# Patient Record
Sex: Male | Born: 1950 | Race: White | Hispanic: No | Marital: Married | Smoking: Former smoker
Health system: Southern US, Community
[De-identification: ages and names within clinical notes are randomized; demographics above are authoritative.]

## PROBLEM LIST (undated history)

## (undated) DIAGNOSIS — M1990 Unspecified osteoarthritis, unspecified site: Secondary | ICD-10-CM

## (undated) DIAGNOSIS — M51369 Other intervertebral disc degeneration, lumbar region without mention of lumbar back pain or lower extremity pain: Secondary | ICD-10-CM

## (undated) DIAGNOSIS — I10 Essential (primary) hypertension: Secondary | ICD-10-CM

## (undated) DIAGNOSIS — M5030 Other cervical disc degeneration, unspecified cervical region: Secondary | ICD-10-CM

## (undated) DIAGNOSIS — M5136 Other intervertebral disc degeneration, lumbar region: Secondary | ICD-10-CM

## (undated) HISTORY — DX: Other cervical disc degeneration, unspecified cervical region: M50.30

## (undated) HISTORY — PX: HEMORRHOID SURGERY: SHX153

## (undated) HISTORY — DX: Other intervertebral disc degeneration, lumbar region: M51.36

## (undated) HISTORY — DX: Essential (primary) hypertension: I10

## (undated) HISTORY — DX: Other intervertebral disc degeneration, lumbar region without mention of lumbar back pain or lower extremity pain: M51.369

## (undated) HISTORY — PX: KNEE SURGERY: SHX244

## (undated) HISTORY — DX: Unspecified osteoarthritis, unspecified site: M19.90

---

## 2002-02-23 ENCOUNTER — Emergency Department (HOSPITAL_COMMUNITY): Admission: EM | Admit: 2002-02-23 | Discharge: 2002-02-23 | Payer: Self-pay | Admitting: *Deleted

## 2005-07-14 ENCOUNTER — Ambulatory Visit: Payer: Self-pay | Admitting: Family Medicine

## 2005-07-19 ENCOUNTER — Ambulatory Visit: Payer: Self-pay | Admitting: Internal Medicine

## 2005-07-23 ENCOUNTER — Ambulatory Visit (HOSPITAL_BASED_OUTPATIENT_CLINIC_OR_DEPARTMENT_OTHER): Admission: RE | Admit: 2005-07-23 | Discharge: 2005-07-23 | Payer: Self-pay | Admitting: General Surgery

## 2005-07-23 ENCOUNTER — Encounter (INDEPENDENT_AMBULATORY_CARE_PROVIDER_SITE_OTHER): Payer: Self-pay | Admitting: *Deleted

## 2005-09-29 ENCOUNTER — Encounter: Admission: RE | Admit: 2005-09-29 | Discharge: 2005-09-29 | Payer: Self-pay | Admitting: Family Medicine

## 2005-09-29 ENCOUNTER — Ambulatory Visit: Payer: Self-pay | Admitting: Family Medicine

## 2005-10-06 ENCOUNTER — Ambulatory Visit: Payer: Self-pay | Admitting: Family Medicine

## 2006-11-01 ENCOUNTER — Ambulatory Visit: Payer: Self-pay | Admitting: Family Medicine

## 2006-11-10 ENCOUNTER — Ambulatory Visit: Payer: Self-pay | Admitting: Family Medicine

## 2006-11-10 LAB — CONVERTED CEMR LAB
ALT: 34 units/L (ref 0–40)
AST: 31 units/L (ref 0–37)
Albumin: 4.4 g/dL (ref 3.5–5.2)
Alkaline Phosphatase: 48 units/L (ref 39–117)
BUN: 16 mg/dL (ref 6–23)
CO2: 30 meq/L (ref 19–32)
Calcium: 10 mg/dL (ref 8.4–10.5)
Chloride: 98 meq/L (ref 96–112)
Chol/HDL Ratio, serum: 4.3
Cholesterol: 236 mg/dL (ref 0–200)
Creatinine, Ser: 1.2 mg/dL (ref 0.4–1.5)
GFR calc non Af Amer: 67 mL/min
Glomerular Filtration Rate, Af Am: 81 mL/min/{1.73_m2}
Glucose, Bld: 103 mg/dL — ABNORMAL HIGH (ref 70–99)
HCT: 42.9 % (ref 39.0–52.0)
HDL: 54.4 mg/dL (ref >39.0)
Hemoglobin: 14.5 g/dL (ref 13.0–17.0)
LDL DIRECT: 165.5 mg/dL
MCHC: 33.8 g/dL (ref 30.0–36.0)
MCV: 94.4 fL (ref 78.0–100.0)
PSA: 0.71 ng/mL (ref 0.10–4.00)
Platelets: 228 10*3/uL (ref 150–400)
Potassium: 5.2 meq/L — ABNORMAL HIGH (ref 3.5–5.1)
RBC: 4.54 M/uL (ref 4.22–5.81)
RDW: 12.2 % (ref 11.5–14.6)
Sodium: 136 meq/L (ref 135–145)
TSH: 1.94 microintl units/mL (ref 0.35–5.50)
Total Bilirubin: 0.9 mg/dL (ref 0.3–1.2)
Total Protein: 7.4 g/dL (ref 6.0–8.3)
Triglyceride fasting, serum: 114 mg/dL (ref 0–149)
VLDL: 23 mg/dL (ref 0–40)
WBC: 5.9 10*3/uL (ref 4.5–10.5)

## 2006-11-23 ENCOUNTER — Encounter: Admission: RE | Admit: 2006-11-23 | Discharge: 2006-11-23 | Payer: Self-pay | Admitting: Family Medicine

## 2006-11-28 ENCOUNTER — Ambulatory Visit: Payer: Self-pay | Admitting: Internal Medicine

## 2006-12-26 ENCOUNTER — Ambulatory Visit: Payer: Self-pay | Admitting: Internal Medicine

## 2006-12-26 ENCOUNTER — Encounter (INDEPENDENT_AMBULATORY_CARE_PROVIDER_SITE_OTHER): Payer: Self-pay | Admitting: Specialist

## 2007-09-25 ENCOUNTER — Telehealth: Payer: Self-pay | Admitting: Family Medicine

## 2007-10-05 ENCOUNTER — Telehealth: Payer: Self-pay | Admitting: Family Medicine

## 2007-10-12 ENCOUNTER — Ambulatory Visit (HOSPITAL_COMMUNITY): Admission: RE | Admit: 2007-10-12 | Discharge: 2007-10-12 | Payer: Self-pay | Admitting: Family Medicine

## 2007-12-01 ENCOUNTER — Encounter: Payer: Self-pay | Admitting: Family Medicine

## 2011-04-09 NOTE — Op Note (Signed)
[name] [name], [name] [name]                 ACCOUNT NO.:  [id number]   MEDICAL RECORD NO.:  [id number]          PATIENT TYPE:  AMB   LOCATION:  DSC                          FACILITY:  [facility]   PHYSICIAN:  Gabrielle Dare. [name], M.D.DATE OF BIRTH:  September 27, 1951   DATE OF PROCEDURE:  07/23/2005  DATE OF DISCHARGE:                                 OPERATIVE REPORT   PREOPERATIVE DIAGNOSIS:  Anal skin tags x 2.   POSTOPERATIVE DIAGNOSIS:  Anal skin tags x 2.   PROCEDURE:  Excision of anal skin tags x 2.   SURGEON:  Gabrielle Dare. [name], M.D.   ANESTHESIA:  General.   HISTORY OF PRESENT ILLNESS:  The patient is a 60 year old male who I  evaluated in the office for some anal discomfort including itching and  trouble with hygiene.  He is noted to have a large left posterolateral and a  moderate right posterolateral external anal skin tags.  He presents today  for elective excision.   PROCEDURE IN DETAIL:  Informed consent was obtained.  The patient received  intravenous antibiotics.  He was identified and brought to the operating  room.  General anesthesia with LMA was administered.  He was placed in the  lithotomy position.  His perianal region was prepped and draped in a sterile  fashion.  0.25% Marcaine with epinephrine was injected for local anesthetic.  Digital rectal exam was accomplished but did not show any abnormalities  internally.  Attention was directed first to the right sided skin tag which  was about 1 cm in size.  This was sharply incised.  Bovie cautery was used  to get good hemostasis and the tag was sent to pathology.  Next, the large  left anal skin tag was bilobed and this was excised sharply.  It was about 2  cm in size.  Bovie cautery was used to get good hemostasis.  Subsequently,  an additional small piece from the right side was sent, as well.  The wound  was irrigated, hemostasis was insured.  Subsequently, the left wound was  closed in a simple fashion with running 3-0 chromic  and the wound from the  excision of the tag on the right side was also closed similarly in a running  fashion with 3-0 chromic suture.  Some pressure was held and hemostasis was  insured.  A sterile dressing was applied.  Sponge, needle, and instrument  counts were correct.  The patient tolerated the procedure well and was taken  to the recovery room in stable condition.  There were no apparent  complications.      Gabrielle Dare [name], M.D.  Electronically Signed     BET/MEDQ  D:  07/23/2005  T:  07/23/2005  Job:  [id number]

## 2011-04-09 NOTE — Assessment & Plan Note (Signed)
[facility]                                 ON-CALL NOTE   NAME:[name],[name], [name]                      MRN:          [id number]  DATE:09/22/2007                            DOB:          [birth year]    TIME OF CALL:  5:48pm   TELEPHONE NUMBER:  [phone]   SUBJECTIVE:  He has had high blood pressure the last couple of days. It  felt like it was high yesterday. He finally took his blood pressure and  it was 140/89 with a headache this morning and he again had a headache.  His blood pressure was 149/89, which went down to 137/90, which he took  late this afternoon. He took it when he got home and it was 173/93. I  was not sure that it was right, so repeated it was 176/96. He now  complains of his left arm and hand being tingly. He takes generic  Lopressor 50 mg in the morning and 25 mg at night. He has not had any  new medications or lifestyle changes. He has been a little agitated and  irritable the last few days.   OBJECTIVE:  Elevated blood pressure without other obvious changes.   PLAN:  I would suggest that he start taking 50 mg twice daily, which  would mean he takes a whole tablet now instead of his usual half tablet.  Do that through the weekend and call Monday for an appointment to be  seen by Dr. Tawanna Cooler and see if the readjustment of medicine was enough.   PRIMARY CARE PHYSICIAN:  Dr. Tawanna Cooler.   HOME OFFICE:  [facility].     Arta Silence, MD  Electronically Signed    RNS/MedQ  DD: 09/22/2007  DT: 09/24/2007  Job #: [id number]

## 2012-01-18 ENCOUNTER — Encounter: Payer: Self-pay | Admitting: Internal Medicine

## 2012-09-07 ENCOUNTER — Encounter: Payer: Self-pay | Admitting: Internal Medicine

## 2013-06-05 ENCOUNTER — Encounter: Payer: Self-pay | Admitting: Internal Medicine

## 2017-01-19 ENCOUNTER — Encounter (INDEPENDENT_AMBULATORY_CARE_PROVIDER_SITE_OTHER): Payer: Self-pay

## 2017-01-19 ENCOUNTER — Ambulatory Visit (INDEPENDENT_AMBULATORY_CARE_PROVIDER_SITE_OTHER): Payer: 59 | Admitting: Orthopedic Surgery

## 2017-01-19 ENCOUNTER — Ambulatory Visit (INDEPENDENT_AMBULATORY_CARE_PROVIDER_SITE_OTHER): Payer: 59

## 2017-01-19 ENCOUNTER — Encounter (INDEPENDENT_AMBULATORY_CARE_PROVIDER_SITE_OTHER): Payer: Self-pay | Admitting: Orthopedic Surgery

## 2017-01-19 VITALS — BP 115/73 | HR 51 | Resp 12 | Ht 66.0 in | Wt 152.0 lb

## 2017-01-19 DIAGNOSIS — M19042 Primary osteoarthritis, left hand: Secondary | ICD-10-CM | POA: Diagnosis not present

## 2017-01-19 DIAGNOSIS — M19041 Primary osteoarthritis, right hand: Secondary | ICD-10-CM

## 2017-01-19 DIAGNOSIS — M1811 Unilateral primary osteoarthritis of first carpometacarpal joint, right hand: Secondary | ICD-10-CM

## 2017-01-19 DIAGNOSIS — M79641 Pain in right hand: Secondary | ICD-10-CM | POA: Diagnosis not present

## 2017-01-19 DIAGNOSIS — M79642 Pain in left hand: Secondary | ICD-10-CM

## 2017-01-19 DIAGNOSIS — M180 Bilateral primary osteoarthritis of first carpometacarpal joints: Secondary | ICD-10-CM | POA: Diagnosis not present

## 2017-01-19 DIAGNOSIS — M1812 Unilateral primary osteoarthritis of first carpometacarpal joint, left hand: Secondary | ICD-10-CM | POA: Diagnosis not present

## 2017-01-19 DIAGNOSIS — M5136 Other intervertebral disc degeneration, lumbar region: Secondary | ICD-10-CM | POA: Insufficient documentation

## 2017-01-19 LAB — CBC WITH DIFFERENTIAL/PLATELET
Basophils Absolute: 59 cells/uL (ref 0–200)
Basophils Relative: 1 %
Eosinophils Absolute: 177 cells/uL (ref 15–500)
Eosinophils Relative: 3 %
HCT: 41.1 % (ref 38.5–50.0)
Hemoglobin: 13.6 g/dL (ref 13.2–17.1)
Lymphocytes Relative: 28 %
Lymphs Abs: 1652 cells/uL (ref 850–3900)
MCH: 30.8 pg (ref 27.0–33.0)
MCHC: 33.1 g/dL (ref 32.0–36.0)
MCV: 93 fL (ref 80.0–100.0)
MPV: 10.3 fL (ref 7.5–12.5)
Monocytes Absolute: 649 cells/uL (ref 200–950)
Monocytes Relative: 11 %
NEUTROS ABS: 3363 {cells}/uL (ref 1500–7800)
Neutrophils Relative %: 57 %
Platelets: 256 10*3/uL (ref 140–400)
RBC: 4.42 MIL/uL (ref 4.20–5.80)
RDW: 13 % (ref 11.0–15.0)
WBC: 5.9 10*3/uL (ref 3.8–10.8)

## 2017-01-19 MED ORDER — LIDOCAINE HCL 1 % IJ SOLN
1.0000 mL | INTRAMUSCULAR | Status: AC | PRN
  Administered 2017-01-19: 1 mL

## 2017-01-19 MED ORDER — BUPIVACAINE HCL 0.5 % IJ SOLN
1.0000 mL | INTRAMUSCULAR | Status: AC | PRN
  Administered 2017-01-19: 1 mL via INTRA_ARTICULAR

## 2017-01-19 MED ORDER — METHYLPREDNISOLONE ACETATE 40 MG/ML IJ SUSP
40.0000 mg | INTRAMUSCULAR | Status: AC | PRN
  Administered 2017-01-19: 40 mg via INTRA_ARTICULAR

## 2017-01-19 NOTE — Progress Notes (Signed)
Office Visit Note   Patient: [name] [name]           Date of Birth: 09/14/1951           MRN: [id number] Visit Date: 01/19/2017              Requested by: No referring provider defined for this encounter. PCP: Marda Stalker, PA-C   Assessment & Plan: Visit Diagnoses:  1. 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 Pain in right hand   3. Pain in left hand   4. Osteoarthritis of left hand, unspecified osteoarthritis type   5. Primary osteoarthritis, right hand     Plan:  #1: Injection of both CMC joints of the hand #2: Obtain rheumatologic screening blood work  Follow-Up Instructions: Return if symptoms worsen or fail to improve.   Orders:  Orders Placed This Encounter  Procedures  . XR Hand Complete Right  . XR Hand Complete Left  . CK (Creatine Kinase)  . C-reactive protein  . CBC with Differential  . COMPLETE METABOLIC PANEL WITH GFR  . Cyclic citrul peptide antibody, IgG  . Rheumatoid Factor  . Antinuclear Antib (ANA)  . Uric acid   No orders of the defined types were placed in this encounter.     Procedures: Medium Joint Inj Date/Time: 01/19/2017 3:33 PM Performed by: Biagio Borg D Authorized by: Biagio Borg D   Consent Given by:  Patient Site marked: the procedure site was marked   Timeout: prior to procedure the correct patient, procedure, and site was verified   Indications:  Pain Location:  Wrist Site:  R intercarpal Prep: patient was prepped and draped in usual sterile fashion   Needle Size:  25 G Needle Length:  1.5 inches Approach:  Anteromedial Ultrasound Guided: No   Fluoroscopic Guidance: No   Medications:  1 mL lidocaine 1 %; 1 mL bupivacaine 0.5 %; 40 mg methylPREDNISolone acetate 40 MG/ML Aspiration Attempted: No   Medium Joint Inj Date/Time: 01/19/2017 3:34 PM Performed by: Biagio Borg D Authorized by: Biagio Borg D   Consent Given by:  Patient Site marked: the procedure site was marked     Timeout: prior to procedure the correct patient, procedure, and site was verified   Indications:  Pain Location:  Wrist Site:  L intercarpal Prep: patient was prepped and draped in usual sterile fashion   Needle Size:  25 G Needle Length:  1.5 inches Approach:  Anteromedial Ultrasound Guided: No   Fluoroscopic Guidance: No   Medications:  1 mL lidocaine 1 %; 1 mL bupivacaine 0.5 %; 40 mg methylPREDNISolone acetate 40 MG/ML Aspiration Attempted: No       Clinical Data: No additional findings.   Subjective: Chief Complaint  Patient presents with  . Left Thumb - Pain  . Right Thumb - Pain    Bil thumb joint  pain x 10 years, makes eye glasses, worse x 2 1/2 years, swelling, severe pain, Diclofenac po and diclofenac gel helps however less effective - using more daily, not diabetic, no previous thumb surgery, hand exercises.    Review of Systems  Constitutional: Negative.   HENT: Negative.   Respiratory: Negative.   Cardiovascular: Negative.   Gastrointestinal: Negative.   Genitourinary: Negative.   Skin: Negative.   Neurological: Negative.   Hematological: Negative.   Psychiatric/Behavioral: Negative.      Objective: Vital Signs: BP 115/73 (BP Location: Right Arm, Patient Position: Sitting, Cuff Size: Normal)   Pulse Marland Kitchen)  51   Resp 12   Ht 5\' 6"  (1.676 m)   Wt 152 lb (68.9 kg)   BMI 24.53 kg/m   Physical Exam  Constitutional: He is oriented to person, place, and time. He appears well-developed and well-nourished.  HENT:  Head: Normocephalic and atraumatic.  Eyes: EOM are normal. Pupils are equal, round, and reactive to light.  Pulmonary/Chest: Effort normal.  Neurological: He is alert and oriented to person, place, and time.  Skin: Skin is warm and dry.  Psychiatric: He has a normal mood and affect. His behavior is normal. Judgment and thought content normal.    Ortho Exam  Bilateral first CMC pain to palpation. He does have some swelling diffusely of  the MCP joints of the index and long finger bilaterally. Do not feel any warmth  Specialty Comments:  No specialty comments available.  Imaging: Xr Hand Complete Left  Result Date: 01/19/2017 Three-view x-rays left hand reveals joint space narrowing second and third MCP with cystic changes in the metacarpal heads. Does have some subluxation of the first [facility] joint.  Xr Hand Complete Right  Result Date: 01/19/2017 3 views right hand reveals first second third distal metacarpal changes. Joint space narrowing especially second and third CP joints. Does have a large cyst proximal first metacarpal generative changes in the first [facility] joint with subluxation of the metacarpal.    PMFS History: Patient Active Problem List   Diagnosis Date Noted  . DDD (degenerative disc disease), lumbar    Past Medical History:  Diagnosis Date  . DDD (degenerative disc disease), cervical   . DDD (degenerative disc disease), lumbar   . DDD (degenerative disc disease), lumbar   . Hypertension   . Osteoarthritis     History reviewed. No pertinent family history.  Past Surgical History:  Procedure Laterality Date  . HEMORRHOID SURGERY    . KNEE SURGERY     Social History   Occupational History  . Not on file.   Social History Main Topics  . Smoking status: Former Smoker    Packs/day: 0.25    Years: 30.00    Types: Cigarettes    Quit date: 2000  . Smokeless tobacco: Never Used  . Alcohol use 8.4 oz/week    14 Cans of beer per week  . Drug use: No  . Sexual activity: Not on file

## 2017-01-20 LAB — COMPLETE METABOLIC PANEL WITH GFR
ALBUMIN: 4.6 g/dL (ref 3.6–5.1)
ALT: 24 U/L (ref 9–46)
AST: 26 U/L (ref 10–35)
Alkaline Phosphatase: 57 U/L (ref 40–115)
BUN: 22 mg/dL (ref 7–25)
CO2: 20 mmol/L (ref 20–31)
CREATININE: 1.12 mg/dL (ref 0.70–1.25)
Calcium: 9.7 mg/dL (ref 8.6–10.3)
Chloride: 97 mmol/L — ABNORMAL LOW (ref 98–110)
GFR, Est African American: 80 mL/min (ref >=60)
GFR, Est Non African American: 70 mL/min (ref >=60)
GLUCOSE: 71 mg/dL (ref 65–99)
Potassium: 5.2 mmol/L (ref 3.5–5.3)
Sodium: 133 mmol/L — ABNORMAL LOW (ref 135–146)
Total Bilirubin: 0.4 mg/dL (ref 0.2–1.2)
Total Protein: 6.9 g/dL (ref 6.1–8.1)

## 2017-01-20 LAB — RHEUMATOID FACTOR: Rheumatoid fact SerPl-aCnc: <14 IU/mL (ref <14)

## 2017-01-20 LAB — ANA: Anti Nuclear Antibody(ANA): NEGATIVE

## 2017-01-20 LAB — CYCLIC CITRUL PEPTIDE ANTIBODY, IGG: Cyclic Citrullin Peptide Ab: <16 Units

## 2017-01-20 LAB — C-REACTIVE PROTEIN: CRP: 0.8 mg/L (ref <8.0)

## 2017-01-20 LAB — CK: CK TOTAL: 164 U/L (ref 7–232)

## 2017-01-20 LAB — URIC ACID: Uric Acid, Serum: 5.2 mg/dL (ref 4.0–8.0)

## 2017-01-26 ENCOUNTER — Telehealth (INDEPENDENT_AMBULATORY_CARE_PROVIDER_SITE_OTHER): Payer: Self-pay | Admitting: Orthopaedic Surgery

## 2017-01-26 NOTE — Telephone Encounter (Signed)
Patient left a message for Aaron Edelman to call him back to discuss the lab work that was done at his last visit.  [phone].  Thank you

## 2017-01-27 NOTE — Telephone Encounter (Signed)
See message.

## 2017-02-01 ENCOUNTER — Telehealth (INDEPENDENT_AMBULATORY_CARE_PROVIDER_SITE_OTHER): Payer: Self-pay | Admitting: Orthopaedic Surgery

## 2017-02-01 NOTE — Telephone Encounter (Signed)
Please call.

## 2017-02-01 NOTE — Telephone Encounter (Signed)
Please call results

## 2017-02-01 NOTE — Telephone Encounter (Signed)
Patient left a message this morning wanting to get his lab results.  [phone].  Thank you.

## 2017-02-03 NOTE — Telephone Encounter (Signed)
clled reults yesterday

## 2017-02-03 NOTE — Telephone Encounter (Signed)
thanks

## 2017-05-11 ENCOUNTER — Other Ambulatory Visit: Payer: Self-pay | Admitting: Orthopaedic Surgery

## 2017-05-11 ENCOUNTER — Ambulatory Visit (INDEPENDENT_AMBULATORY_CARE_PROVIDER_SITE_OTHER): Payer: 59 | Admitting: Orthopedic Surgery

## 2017-05-11 ENCOUNTER — Ambulatory Visit (INDEPENDENT_AMBULATORY_CARE_PROVIDER_SITE_OTHER): Payer: 59

## 2017-05-11 ENCOUNTER — Encounter (INDEPENDENT_AMBULATORY_CARE_PROVIDER_SITE_OTHER): Payer: Self-pay | Admitting: Orthopedic Surgery

## 2017-05-11 ENCOUNTER — Ambulatory Visit
Admission: RE | Admit: 2017-05-11 | Discharge: 2017-05-11 | Disposition: A | Discharge status: Home / Self Care | Payer: 59 | Source: Ambulatory Visit | Attending: Orthopaedic Surgery | Admitting: Orthopaedic Surgery

## 2017-05-11 VITALS — BP 112/65 | HR 60 | Resp 15 | Ht 66.0 in | Wt 152.0 lb

## 2017-05-11 DIAGNOSIS — M25562 Pain in left knee: Secondary | ICD-10-CM | POA: Diagnosis not present

## 2017-05-11 DIAGNOSIS — G8929 Other chronic pain: Secondary | ICD-10-CM

## 2017-05-11 DIAGNOSIS — M5416 Radiculopathy, lumbar region: Secondary | ICD-10-CM

## 2017-05-11 DIAGNOSIS — M1712 Unilateral primary osteoarthritis, left knee: Secondary | ICD-10-CM

## 2017-05-11 MED ORDER — BUPIVACAINE HCL 0.5 % IJ SOLN
3.0000 mL | INTRAMUSCULAR | Status: AC | PRN
  Administered 2017-05-11: 3 mL via INTRA_ARTICULAR

## 2017-05-11 MED ORDER — LIDOCAINE HCL 1 % IJ SOLN
5.0000 mL | INTRAMUSCULAR | Status: AC | PRN
  Administered 2017-05-11: 5 mL

## 2017-05-11 MED ORDER — METHYLPREDNISOLONE ACETATE 40 MG/ML IJ SUSP
80.0000 mg | INTRAMUSCULAR | Status: AC | PRN
Start: 2017-05-11 — End: 2017-05-11
  Administered 2017-05-11: 80 mg

## 2017-05-11 NOTE — Progress Notes (Signed)
Office Visit Note   Patient: [name] [name]           Date of Birth: 08/25/1951           MRN: [id number] Visit Date: 05/11/2017              Requested by: [name] Stalker, PA-C [name] [name], [place] [id number] PCP: [name] Stalker, PA-C   Assessment & Plan: Visit Diagnoses:  1. Unilateral primary osteoarthritis, left knee   2. Chronic pain of left knee     Plan:  #1: Corticosteroid injection was given today atraumatically. He did have marked relief in his knee pain but as he started to walk he started getting his spinal stenotic pain. This was more in the lateral aspect of his hip #2: Since his Visco supplementation was beneficial previously were going to pre-certify for another round. #3: Follow back up once approved.  Follow-Up Instructions: No Follow-up on file.   Orders:  Orders Placed This Encounter  Procedures  . XR Knee Complete 4 Views Left   No orders of the defined types were placed in this encounter.     Procedures: Large Joint Inj Date/Time: 05/11/2017 10:23 AM Performed by: [name] [name] Authorized by: [name] [name]   Consent Given by:  Patient Timeout: prior to procedure the correct patient, procedure, and site was verified   Indications:  Pain and joint swelling Location:  Knee Site:  L knee Prep: patient was prepped and draped in usual sterile fashion   Needle Size:  25 G Needle Length:  1.5 inches Approach:  Anteromedial Ultrasound Guidance: No   Fluoroscopic Guidance: No   Arthrogram: No   Medications:  5 mL lidocaine 1 %; 80 mg methylPREDNISolone acetate 40 MG/ML; 3 mL bupivacaine 0.5 % Aspiration Attempted: No   Patient tolerance:  Patient tolerated the procedure well with no immediate complications     Clinical Data: No additional findings.   Subjective: Chief Complaint  Patient presents with  . Left Knee - Pain  . Knee Pain    Knee pain worsening, twisted knee x 1 1/2, difficulty walking, wants inj., low  back pain, gait change, using cane, out of work since 05/05/17, no swellling, popping, clicking, grinding noise, difficulty sleeping at night, not diabetic, arthroscopic surgery MW 2004, Voltaren and tylenol - cannot tell if it's helping,    [name] [name] is a very pleasant 66 year old white male who is seen today for evaluation of his left knee. He states that he has been continuing to have pain in the left knee and has had previous x-rays revealing bone-on-bone medial compartment OA. He is also having spinal stenosis of the lumbar spine which is giving him pain and discomfort into his left leg more in the lateral hip and anterior and some in the groin. He is being followed by neurosurgery and is having an MRI hopefully schedule this once he gets approval for it for the lumbar spine. He states though that he has to use a cane now because of his gait change and the pain in his left knee. He has been taken out of work now on 05/05/2017. He does not have difficulty at night with sleeping. Previous arthroscopic surgery in 2004. He has tried Voltaren and Tylenol which has not been much in the way of any benefit. Seen today for evaluation.        Review of Systems  Constitutional: Negative.   HENT: Negative.   Respiratory: Negative.   Cardiovascular: Negative.  Gastrointestinal: Negative.   Genitourinary: Negative.   Skin: Negative.   Neurological: Negative.   Hematological: Negative.   Psychiatric/Behavioral: Negative.      Objective: Vital Signs: BP 112/65 (BP Location: Right Arm, Patient Position: Sitting, Cuff Size: Normal)   Pulse 60   Resp 15   Ht 5\' 6"  (1.676 m)   Wt 152 lb (68.9 kg)   BMI 24.53 kg/m   Physical Exam  Constitutional: He is oriented to person, place, and time. He appears well-developed and well-nourished.  HENT:  Head: Normocephalic and atraumatic.  Eyes: EOM are normal. Pupils are equal, round, and reactive to light.  Pulmonary/Chest: Effort normal.  Neurological: He  is alert and oriented to person, place, and time.  Skin: Skin is warm and dry.  Psychiatric: He has a normal mood and affect. His behavior is normal. Judgment and thought content normal.    Ortho Exam  Range of motion reveals near full extension to about 105. Tender at the medial joint line to palpation. He has varus and does look about 13-14 clinically. I can correct him to almost neutral. He does not have much pain with range of motion of the hip.  Specialty Comments:  No specialty comments available.  Imaging: No results found.   PMFS History: Patient Active Problem List   Diagnosis Date Noted  . DDD (degenerative disc disease), lumbar    Past Medical History:  Diagnosis Date  . DDD (degenerative disc disease), cervical   . DDD (degenerative disc disease), lumbar   . DDD (degenerative disc disease), lumbar   . Hypertension   . Osteoarthritis     History reviewed. No pertinent family history.  Past Surgical History:  Procedure Laterality Date  . HEMORRHOID SURGERY    . KNEE SURGERY     Social History   Occupational History  . Not on file.   Social History Main Topics  . Smoking status: Former Smoker    Packs/day: 0.25    Years: 30.00    Types: Cigarettes    Quit date: 2000  . Smokeless tobacco: Never Used  . Alcohol use 8.4 oz/week    14 Cans of beer per week  . Drug use: No  . Sexual activity: Not on file

## 2017-05-20 ENCOUNTER — Telehealth (INDEPENDENT_AMBULATORY_CARE_PROVIDER_SITE_OTHER): Payer: Self-pay | Admitting: Orthopaedic Surgery

## 2017-05-20 NOTE — Telephone Encounter (Signed)
Larene Beach from [facility] called to advise that the medical director has denied Euflexxa for this patient.  We can do a peer to peer, but it must be done within 21 days of today's date.  The peer to peer telephone # is [phone] option 3.  REF# [id number].  Thank you.

## 2017-05-23 NOTE — Telephone Encounter (Signed)
Please advise 

## 2017-05-24 ENCOUNTER — Telehealth (INDEPENDENT_AMBULATORY_CARE_PROVIDER_SITE_OTHER): Payer: Self-pay | Admitting: Rheumatology

## 2017-05-24 NOTE — Telephone Encounter (Signed)
Please advise 

## 2017-05-24 NOTE — Telephone Encounter (Signed)
Ok to do

## 2017-05-24 NOTE — Telephone Encounter (Signed)
Patient called and said he received his denial from [facility] for [name] [name].  I told him there was a note in chart for Dr Durward Fortes to call for peer to peer.  Please call him and let him know once you have called, thanks.

## 2017-05-24 NOTE — Telephone Encounter (Signed)
(   Dr. Durward Fortes patient) Patient would like to know about getting an rx for an Unloader Brace for his knee. Patient would like to discuss this with someone. Patient has several questions regarding brace, Rx for brace, and facility where he would get the brace. Please call patient to discuss.

## 2017-05-26 ENCOUNTER — Telehealth (INDEPENDENT_AMBULATORY_CARE_PROVIDER_SITE_OTHER): Payer: Self-pay

## 2017-05-26 NOTE — Telephone Encounter (Signed)
Called pt and wrote rx for [facility]. Left a fd

## 2017-06-01 ENCOUNTER — Telehealth (INDEPENDENT_AMBULATORY_CARE_PROVIDER_SITE_OTHER): Payer: Self-pay | Admitting: Orthopaedic Surgery

## 2017-06-01 NOTE — Telephone Encounter (Signed)
05/11/2017 ov note faxed to [facility]

## 2017-06-02 ENCOUNTER — Telehealth (INDEPENDENT_AMBULATORY_CARE_PROVIDER_SITE_OTHER): Payer: Self-pay | Admitting: Orthopaedic Surgery

## 2017-06-02 NOTE — Telephone Encounter (Signed)
Patient called in regards to his Hyalgan injections.  Wants to know the status of this and the peer to peer.

## 2017-06-02 NOTE — Telephone Encounter (Signed)
Please advise 

## 2017-06-10 ENCOUNTER — Telehealth: Payer: Self-pay

## 2017-06-10 NOTE — Telephone Encounter (Signed)
I have spoken with patient multiple times today and he is very upset that he has called multiple times to ask about his injection approval per insurance and no one has called him back.  Looking at the chart notes it appears that peer to peer was not done.  Patient has been seeing Aaron Edelman and has been going to PT for left knee pain, he has been out of work all this time as well.  He has many concerns that we did not do what we were supposed to do with this.  I have advised him we would look further into what happened so as to avoid this in the future.  He is now scheduled for injections to start next week with Aaron Edelman, and per Dr Durward Fortes, ok to supply meds for patient and do no charge visits.

## 2017-06-10 NOTE — Telephone Encounter (Signed)
Spoke with [facility] rep and he wanted me to send office notes to show this pt has OA of left knee. Faxed info today.

## 2017-06-14 ENCOUNTER — Ambulatory Visit (INDEPENDENT_AMBULATORY_CARE_PROVIDER_SITE_OTHER): Payer: 59 | Admitting: Orthopedic Surgery

## 2017-06-14 ENCOUNTER — Encounter (INDEPENDENT_AMBULATORY_CARE_PROVIDER_SITE_OTHER): Payer: Self-pay | Admitting: Orthopedic Surgery

## 2017-06-14 DIAGNOSIS — M1712 Unilateral primary osteoarthritis, left knee: Secondary | ICD-10-CM

## 2017-06-14 MED ORDER — SODIUM HYALURONATE (VISCOSUP) 20 MG/2ML IX SOSY
20.0000 mg | PREFILLED_SYRINGE | INTRA_ARTICULAR | Status: AC | PRN
  Administered 2017-06-14: 20 mg via INTRA_ARTICULAR

## 2017-06-14 MED ORDER — LIDOCAINE HCL 2 % IJ SOLN
2.0000 mL | INTRAMUSCULAR | Status: AC | PRN
  Administered 2017-06-14: 2 mL

## 2017-06-14 NOTE — Progress Notes (Signed)
Office Visit Note   Patient: [name] [name]           Date of Birth: Dec 04, 1950           MRN: [id number] Visit Date: 06/14/2017              Requested by: Marda Stalker, PA-C [place], [place] [id number] PCP: Marda Stalker, PA-C   Assessment & Plan: Visit Diagnoses:  1. Unilateral primary osteoarthritis, left knee     Plan: #1: First Euflex injection was given without difficulty to the left knee #2: Follow back up in 1 week for the second injection.  Follow-Up Instructions: Return in about 1 week (around 06/21/2017).   Orders:  No orders of the defined types were placed in this encounter.  No orders of the defined types were placed in this encounter.     Procedures: Large Joint Inj Date/Time: 06/14/2017 4:49 PM Performed by: Biagio Borg D Authorized by: Biagio Borg D   Consent Given by:  Patient Timeout: prior to procedure the correct patient, procedure, and site was verified   Indications:  Pain and joint swelling Location:  Knee Site:  L knee Prep: patient was prepped and draped in usual sterile fashion   Needle Size:  25 G Needle Length:  1.5 inches Approach:  Anteromedial Ultrasound Guidance: No   Fluoroscopic Guidance: No   Arthrogram: No   Medications:  20 mg Sodium Hyaluronate 20 MG/2ML; 2 mL lidocaine 2 % Aspiration Attempted: No   Patient tolerance:  Patient tolerated the procedure well with no immediate complications     Clinical Data: No additional findings.   Subjective: No chief complaint on file.   [name] [name] is a very pleasant 66 year old white male who is seen today for evaluation of his left knee. He states that he has been continuing to have pain in the left knee and has had previous x-rays revealing bone-on-bone medial compartment OA. He is also having spinal stenosis of the lumbar spine which is giving him pain and discomfort into his left leg more in the lateral hip and anterior and some in the groin. He is  being followed by neurosurgery and has had an MRI. Apparently they've reviewed this with the neurosurgeon who feels that he has is a candidate for essentially an L1-L5 arthrodesis. However he did not feel confident that this would saw his problems. He states though that he has to use a cane now because of his gait change and the pain in his left knee. He has been taken out of work now on 05/05/2017. He does not have difficulty at night with sleeping. Previous arthroscopic surgery in 2004. He has tried Voltaren and Tylenol which has not been much in the way of any benefit. Recently I instilled a corticosteroid injection to his left knee. He did have some benefit. He would like to have a Visco supplementation injection which gave him much relief previously.  He is also had a brace from the brace shop which is basically an unloading brace. He feels it is made a difference. Also his neurosurgeon has started physical therapy and he states that he is actually had improvement in his pain in his back from that.    Review of Systems  Constitutional: Negative.   HENT: Negative.   Respiratory: Negative.   Cardiovascular: Negative.   Gastrointestinal: Negative.   Genitourinary: Negative.   Skin: Negative.   Neurological: Negative.   Hematological: Negative.   Psychiatric/Behavioral: Negative.  Objective: Vital Signs: There were no vitals taken for this visit.  Physical Exam  Constitutional: He is oriented to person, place, and time. He appears well-developed and well-nourished.  HENT:  Head: Normocephalic and atraumatic.  Eyes: Pupils are equal, round, and reactive to light. EOM are normal.  Pulmonary/Chest: Effort normal.  Neurological: He is alert and oriented to person, place, and time.  Skin: Skin is warm and dry.  Psychiatric: He has a normal mood and affect. His behavior is normal. Judgment and thought content normal.    Ortho Exam  Range of motion reveals near full extension to  about 105. Tender at the medial joint line to palpation. He has varus and does look about 13-14 clinically. I can correct him to almost neutral.   Specialty Comments:  No specialty comments available.  Imaging: No results found.   PMFS History: Current Outpatient Prescriptions  Medication Sig Dispense Refill  . ALPRAZolam (XANAX) 0.25 MG tablet     . atorvastatin (LIPITOR) 10 MG tablet     . diclofenac (VOLTAREN) 50 MG EC tablet     . gabapentin (NEURONTIN) 300 MG capsule     . ketoconazole (NIZORAL) 2 % cream     . lisinopril (PRINIVIL,ZESTRIL) 20 MG tablet     . LYRICA 150 MG capsule      No current facility-administered medications for this visit.      Patient Active Problem List   Diagnosis Date Noted  . Unilateral primary osteoarthritis, left knee 06/14/2017  . DDD (degenerative disc disease), lumbar    Past Medical History:  Diagnosis Date  . DDD (degenerative disc disease), cervical   . DDD (degenerative disc disease), lumbar   . DDD (degenerative disc disease), lumbar   . Hypertension   . Osteoarthritis     No family history on file.  Past Surgical History:  Procedure Laterality Date  . HEMORRHOID SURGERY    . KNEE SURGERY     Social History   Occupational History  . Not on file.   Social History Main Topics  . Smoking status: Former Smoker    Packs/day: 0.25    Years: 30.00    Types: Cigarettes    Quit date: 2000  . Smokeless tobacco: Never Used  . Alcohol use 8.4 oz/week    14 Cans of beer per week  . Drug use: No  . Sexual activity: Not on file

## 2017-06-17 NOTE — Telephone Encounter (Signed)
Per insurance, Euflexxa denial reversed. Euflexxa approved.

## 2017-06-22 ENCOUNTER — Ambulatory Visit (INDEPENDENT_AMBULATORY_CARE_PROVIDER_SITE_OTHER): Payer: 59 | Admitting: Orthopedic Surgery

## 2017-06-22 ENCOUNTER — Encounter (INDEPENDENT_AMBULATORY_CARE_PROVIDER_SITE_OTHER): Payer: Self-pay | Admitting: Orthopedic Surgery

## 2017-06-22 DIAGNOSIS — M1712 Unilateral primary osteoarthritis, left knee: Secondary | ICD-10-CM

## 2017-06-22 MED ORDER — SODIUM HYALURONATE (VISCOSUP) 20 MG/2ML IX SOSY
20.0000 mg | PREFILLED_SYRINGE | INTRA_ARTICULAR | Status: AC | PRN
  Administered 2017-06-22: 20 mg via INTRA_ARTICULAR

## 2017-06-22 MED ORDER — LIDOCAINE HCL 1 % IJ SOLN
3.0000 mL | INTRAMUSCULAR | Status: AC | PRN
  Administered 2017-06-22: 3 mL

## 2017-06-22 NOTE — Progress Notes (Signed)
Office Visit Note   Patient: [name] [name]           Date of Birth: [birth year]           MRN: [id number] Visit Date: 06/22/2017              Requested by: Marda Stalker, PA-C [place], [place] [address] PCP: Marda Stalker, PA-C   Assessment & Plan: Visit Diagnoses:  1. Unilateral primary osteoarthritis, left knee     Plan: #1: Second Euflex injection was given without difficulty today.  Follow-Up Instructions: Return in about 1 week (around 06/29/2017).   Orders:  Orders Placed This Encounter  Procedures  . Large Joint Injection/Arthrocentesis   No orders of the defined types were placed in this encounter.     Procedures: Large Joint Inj Date/Time: 06/22/2017 9:34 AM Performed by: Biagio Borg D Authorized by: Biagio Borg D   Consent Given by:  Patient Timeout: prior to procedure the correct patient, procedure, and site was verified   Indications:  Pain and joint swelling Location:  Knee Site:  L knee Prep: patient was prepped and draped in usual sterile fashion   Needle Size:  25 G Needle Length:  1.5 inches Approach:  Anteromedial Ultrasound Guidance: No   Fluoroscopic Guidance: No   Arthrogram: No   Medications:  20 mg Sodium Hyaluronate 20 MG/2ML; 3 mL lidocaine 1 % Aspiration Attempted: No   Patient tolerance:  Patient tolerated the procedure well with no immediate complications     Clinical Data: No additional findings.   Subjective: No chief complaint on file.   [name] [name] is a 66 year old white male who is seen today for his second Euflex injection. He states his little bit sore after his first run but is doing well overall. He certainly is enjoying his new brace. Denies any reactivity otherwise.    Review of Systems  Constitutional: Negative.   HENT: Negative.   Respiratory: Negative.   Cardiovascular: Negative.   Gastrointestinal: Negative.   Genitourinary: Negative.   Skin: Negative.   Neurological: Negative.    Hematological: Negative.   Psychiatric/Behavioral: Negative.      Objective: Vital Signs: There were no vitals taken for this visit.  Physical Exam  Constitutional: He is oriented to person, place, and time. He appears well-developed and well-nourished.  HENT:  Head: Normocephalic and atraumatic.  Eyes: Pupils are equal, round, and reactive to light. EOM are normal.  Pulmonary/Chest: Effort normal.  Neurological: He is alert and oriented to person, place, and time.  Skin: Skin is warm and dry.  Psychiatric: He has a normal mood and affect. His behavior is normal. Judgment and thought content normal.    Ortho Exam  Today the knee is benign. No warmth or erythema. Does have a trace effusion. No reactivity.  Specialty Comments:  No specialty comments available.  Imaging: No results found.   PMFS History: Patient Active Problem List   Diagnosis Date Noted  . Unilateral primary osteoarthritis, left knee 06/14/2017  . DDD (degenerative disc disease), lumbar    Past Medical History:  Diagnosis Date  . DDD (degenerative disc disease), cervical   . DDD (degenerative disc disease), lumbar   . DDD (degenerative disc disease), lumbar   . Hypertension   . Osteoarthritis     No family history on file.  Past Surgical History:  Procedure Laterality Date  . HEMORRHOID SURGERY    . KNEE SURGERY     Social History   Occupational History  .  Not on file.   Social History Main Topics  . Smoking status: Former Smoker    Packs/day: 0.25    Years: 30.00    Types: Cigarettes    Quit date: 2000  . Smokeless tobacco: Never Used  . Alcohol use 8.4 oz/week    14 Cans of beer per week  . Drug use: No  . Sexual activity: Not on file

## 2017-06-29 ENCOUNTER — Encounter (INDEPENDENT_AMBULATORY_CARE_PROVIDER_SITE_OTHER): Payer: Self-pay | Admitting: Orthopedic Surgery

## 2017-06-29 ENCOUNTER — Ambulatory Visit (INDEPENDENT_AMBULATORY_CARE_PROVIDER_SITE_OTHER): Payer: 59 | Admitting: Orthopedic Surgery

## 2017-06-29 DIAGNOSIS — M1712 Unilateral primary osteoarthritis, left knee: Secondary | ICD-10-CM

## 2017-06-29 MED ORDER — LIDOCAINE HCL 1 % IJ SOLN
3.0000 mL | INTRAMUSCULAR | Status: AC | PRN
  Administered 2017-06-29: 3 mL

## 2017-06-29 MED ORDER — SODIUM HYALURONATE (VISCOSUP) 20 MG/2ML IX SOSY
20.0000 mg | PREFILLED_SYRINGE | INTRA_ARTICULAR | Status: AC | PRN
  Administered 2017-06-29: 20 mg via INTRA_ARTICULAR

## 2017-06-29 NOTE — Progress Notes (Signed)
   Office Visit Note   Patient: [name] [name]           Date of Birth: Oct 25, 1951           MRN: [id number] Visit Date: 06/29/2017              Requested by: Marda Stalker, PA-C [place], [place] [address] PCP: Marda Stalker, PA-C   Assessment & Plan: Visit Diagnoses:  1. Unilateral primary osteoarthritis, left knee     Plan:  #1: Third Euflexa injection was given the left knee. Tolerated procedure well. #2: Follow back up when necessary  Follow-Up Instructions: Return if symptoms worsen or fail to improve.   Orders:  No orders of the defined types were placed in this encounter.  No orders of the defined types were placed in this encounter.     Procedures: Large Joint Inj Date/Time: 06/29/2017 9:16 AM Performed by: Biagio Borg D Authorized by: Biagio Borg D   Consent Given by:  Patient Timeout: prior to procedure the correct patient, procedure, and site was verified   Indications:  Pain and joint swelling Location:  Knee Site:  L knee Prep: patient was prepped and draped in usual sterile fashion   Needle Size:  25 G Needle Length:  1.5 inches Approach:  Anteromedial Ultrasound Guidance: No   Fluoroscopic Guidance: No   Arthrogram: No   Medications:  20 mg Sodium Hyaluronate 20 MG/2ML; 3 mL lidocaine 1 % Aspiration Attempted: No   Patient tolerance:  Patient tolerated the procedure well with no immediate complications     Clinical Data: No additional findings.   Subjective: No chief complaint on file.   HPI  [name] [name] returns today for his third Euflexa injection. He is doing well overall. Denies any reactivity.  Review of Systems   Objective: Vital Signs: There were no vitals taken for this visit.  Physical Exam  Ortho Exam  Exam today reveals a need to be benign. No real effusion. No ecchymosis. No warmth or erythema.  Specialty Comments:  No specialty comments available.  Imaging: No results found.   PMFS  History: Patient Active Problem List   Diagnosis Date Noted  . Unilateral primary osteoarthritis, left knee 06/14/2017  . DDD (degenerative disc disease), lumbar    Past Medical History:  Diagnosis Date  . DDD (degenerative disc disease), cervical   . DDD (degenerative disc disease), lumbar   . DDD (degenerative disc disease), lumbar   . Hypertension   . Osteoarthritis     No family history on file.  Past Surgical History:  Procedure Laterality Date  . HEMORRHOID SURGERY    . KNEE SURGERY     Social History   Occupational History  . Not on file.   Social History Main Topics  . Smoking status: Former Smoker    Packs/day: 0.25    Years: 30.00    Types: Cigarettes    Quit date: 2000  . Smokeless tobacco: Never Used  . Alcohol use 8.4 oz/week    14 Cans of beer per week  . Drug use: No  . Sexual activity: Not on file

## 2018-04-13 DIAGNOSIS — E782 Mixed hyperlipidemia: Secondary | ICD-10-CM | POA: Diagnosis not present

## 2018-04-13 DIAGNOSIS — Z23 Encounter for immunization: Secondary | ICD-10-CM | POA: Diagnosis not present

## 2018-04-13 DIAGNOSIS — R001 Bradycardia, unspecified: Secondary | ICD-10-CM | POA: Diagnosis not present

## 2018-04-13 DIAGNOSIS — R9431 Abnormal electrocardiogram [ECG] [EKG]: Secondary | ICD-10-CM | POA: Diagnosis not present

## 2018-04-13 DIAGNOSIS — I10 Essential (primary) hypertension: Secondary | ICD-10-CM | POA: Diagnosis not present

## 2018-04-13 DIAGNOSIS — Z125 Encounter for screening for malignant neoplasm of prostate: Secondary | ICD-10-CM | POA: Diagnosis not present

## 2018-04-13 DIAGNOSIS — Z1211 Encounter for screening for malignant neoplasm of colon: Secondary | ICD-10-CM | POA: Diagnosis not present

## 2018-04-13 DIAGNOSIS — Z0000 Encounter for general adult medical examination without abnormal findings: Secondary | ICD-10-CM | POA: Diagnosis not present

## 2018-04-14 DIAGNOSIS — I10 Essential (primary) hypertension: Secondary | ICD-10-CM | POA: Diagnosis not present

## 2018-04-14 DIAGNOSIS — E782 Mixed hyperlipidemia: Secondary | ICD-10-CM | POA: Diagnosis not present

## 2018-04-14 DIAGNOSIS — Z125 Encounter for screening for malignant neoplasm of prostate: Secondary | ICD-10-CM | POA: Diagnosis not present

## 2018-04-14 DIAGNOSIS — Z23 Encounter for immunization: Secondary | ICD-10-CM | POA: Diagnosis not present

## 2018-04-14 DIAGNOSIS — Z1211 Encounter for screening for malignant neoplasm of colon: Secondary | ICD-10-CM | POA: Diagnosis not present

## 2018-04-14 DIAGNOSIS — R001 Bradycardia, unspecified: Secondary | ICD-10-CM | POA: Diagnosis not present

## 2018-04-14 DIAGNOSIS — Z0000 Encounter for general adult medical examination without abnormal findings: Secondary | ICD-10-CM | POA: Diagnosis not present

## 2018-04-24 ENCOUNTER — Telehealth (INDEPENDENT_AMBULATORY_CARE_PROVIDER_SITE_OTHER): Payer: Self-pay | Admitting: Orthopaedic Surgery

## 2018-04-24 NOTE — Telephone Encounter (Signed)
Patient would like to get next set of gel injections. Patient now has Medicare # [id number]. Patient has BCBS supplement. Please call patient if need more.

## 2018-04-24 NOTE — Telephone Encounter (Signed)
Ok for injections as long as it has been over 6 months since prior injections assuming prior injections

## 2018-04-24 NOTE — Telephone Encounter (Signed)
PLEASE ADVISE.

## 2018-04-25 NOTE — Telephone Encounter (Signed)
Left message requesting BCBS insurance numbers since we dont have his bcbs card on file to file the injections and what site he would like injected. His chart states we did his left knee 6 mo. Ago.

## 2018-04-25 NOTE — Telephone Encounter (Signed)
[facility], left knee only, Dr. Durward Fortes. Thank you.

## 2018-04-25 NOTE — Telephone Encounter (Signed)
BCBS MEM ID [id number] GROUP # [id number] WOULD LIKE LEFT KNEE INJECTIONS. PLEASE SCHEDULE AND ADVISE PT

## 2018-04-26 ENCOUNTER — Telehealth (INDEPENDENT_AMBULATORY_CARE_PROVIDER_SITE_OTHER): Payer: Self-pay

## 2018-04-26 NOTE — Telephone Encounter (Signed)
Noted  

## 2018-04-26 NOTE — Telephone Encounter (Signed)
Submitted application online for SynviscOne injection, left knee.  

## 2018-05-03 DIAGNOSIS — E785 Hyperlipidemia, unspecified: Secondary | ICD-10-CM | POA: Diagnosis not present

## 2018-05-03 DIAGNOSIS — R001 Bradycardia, unspecified: Secondary | ICD-10-CM | POA: Diagnosis not present

## 2018-05-03 DIAGNOSIS — R9431 Abnormal electrocardiogram [ECG] [EKG]: Secondary | ICD-10-CM | POA: Diagnosis not present

## 2018-05-03 DIAGNOSIS — I10 Essential (primary) hypertension: Secondary | ICD-10-CM | POA: Diagnosis not present

## 2018-05-03 DIAGNOSIS — K219 Gastro-esophageal reflux disease without esophagitis: Secondary | ICD-10-CM | POA: Diagnosis not present

## 2018-05-05 ENCOUNTER — Encounter (INDEPENDENT_AMBULATORY_CARE_PROVIDER_SITE_OTHER): Payer: Self-pay | Admitting: Radiology

## 2018-05-05 NOTE — Progress Notes (Unsigned)
Can you please call patient and sched appt for Synvisc One injection?  [name] [name] patient, thanks.  Patient's primary ins should cover 80% of costs, and he Serita Degroote owe up to $350 approximate OOP, since his deductible is not met for his secondary ins. He would be billed later for this amount. Any questions, I will be glad to speak with him. We will buy and bill, no PA needed.

## 2018-05-15 DIAGNOSIS — I10 Essential (primary) hypertension: Secondary | ICD-10-CM | POA: Diagnosis not present

## 2018-05-29 ENCOUNTER — Encounter (INDEPENDENT_AMBULATORY_CARE_PROVIDER_SITE_OTHER): Payer: Self-pay | Admitting: Orthopaedic Surgery

## 2018-05-29 ENCOUNTER — Ambulatory Visit (INDEPENDENT_AMBULATORY_CARE_PROVIDER_SITE_OTHER): Payer: Medicare Other | Admitting: Orthopaedic Surgery

## 2018-05-29 VITALS — BP 123/73 | HR 61 | Ht 65.0 in | Wt 150.0 lb

## 2018-05-29 DIAGNOSIS — M1712 Unilateral primary osteoarthritis, left knee: Secondary | ICD-10-CM

## 2018-05-29 MED ORDER — HYLAN G-F 20 48 MG/6ML IX SOSY
48.0000 mg | PREFILLED_SYRINGE | INTRA_ARTICULAR | Status: AC | PRN
  Administered 2018-05-29: 48 mg via INTRA_ARTICULAR

## 2018-05-29 NOTE — Progress Notes (Signed)
Office Visit Note   Patient: [name] [name]           Date of Birth: 10-23-1951           MRN: [id number] Visit Date: 05/29/2018              Requested by: Marda Stalker, PA-C [place], [place] [address] PCP: Marda Stalker, PA-C   Assessment & Plan: Visit Diagnoses:  1. Unilateral primary osteoarthritis, left knee     Plan: Mr. Livecchi has end-stage osteoarthritis of his left knee.  He is had prior Euflexxa injections with excellent response.  His insurance has changed and now they have approved Synvisc 1.  He is to receive that injection today.  He is about a year since his last injection.  He has been wearing a medial unloading brace to the left knee.  We will give him a prescription for a new brace as this 1 is "wearing out" . Follow-Up Instructions: Return if symptoms worsen or fail to improve.   Orders:  No orders of the defined types were placed in this encounter.  No orders of the defined types were placed in this encounter.     Procedures: Large Joint Inj: L knee on 05/29/2018 1:16 PM Indications: pain and joint swelling Details: 25 G 1.5 in needle, anteromedial approach  Arthrogram: No  Medications: 48 mg Hylan 48 MG/6ML Outcome: tolerated well, no immediate complications Procedure, treatment alternatives, risks and benefits explained, specific risks discussed. Consent was given by the patient. Immediately prior to procedure a time out was called to verify the correct patient, procedure, equipment, support staff and site/side marked as required. Patient was prepped and draped in the usual sterile fashion.       Clinical Data: No additional findings.   Subjective: No chief complaint on file. No recent history of fever or chills.  Pain left knee predominant along the medial compartment.  HPI  Review of Systems   Objective: Vital Signs: BP 123/73 (BP Location: Left Arm, Patient Position: Sitting, Cuff Size: Normal)   Pulse 61    Physical Exam  Constitutional: He is oriented to person, place, and time. He appears well-developed and well-nourished.  HENT:  Mouth/Throat: Oropharynx is clear and moist.  Eyes: Pupils are equal, round, and reactive to light. EOM are normal.  Pulmonary/Chest: Effort normal.  Neurological: He is alert and oriented to person, place, and time.  Skin: Skin is warm and dry.  Psychiatric: He has a normal mood and affect. His behavior is normal.    Ortho Exam awake alert and oriented x3.  Comfortable sitting.  Slight increased varus left knee with weightbearing.  Predominant medial joint pain.  No lateral joint pain.  No effusion.  Lacks just a few degrees to full extension flexed over 105 degrees.  No instability.  No distal edema.  Neurovascular exam intact  Specialty Comments:  No specialty comments available.  Imaging: No results found.   PMFS History: Patient Active Problem List   Diagnosis Date Noted  . Unilateral primary osteoarthritis, left knee 06/14/2017  . DDD (degenerative disc disease), lumbar    Past Medical History:  Diagnosis Date  . DDD (degenerative disc disease), cervical   . DDD (degenerative disc disease), lumbar   . DDD (degenerative disc disease), lumbar   . Hypertension   . Osteoarthritis     History reviewed. No pertinent family history.  Past Surgical History:  Procedure Laterality Date  . HEMORRHOID SURGERY    .  KNEE SURGERY     Social History   Occupational History  . Not on file  Tobacco Use  . Smoking status: Former Smoker    Packs/day: 0.25    Years: 30.00    Pack years: 7.50    Types: Cigarettes    Last attempt to quit: 2000    Years since quitting: 19.5  . Smokeless tobacco: Never Used  Substance and Sexual Activity  . Alcohol use: Yes    Alcohol/week: 8.4 oz    Types: 14 Cans of beer per week  . Drug use: No  . Sexual activity: Not on file     Garald Balding, MD   Note - This record has been created using [employer].  Chart creation errors have been sought, but may not always  have been located. Such creation errors do not reflect on  the standard of medical care.

## 2018-07-20 DIAGNOSIS — G894 Chronic pain syndrome: Secondary | ICD-10-CM | POA: Diagnosis not present

## 2018-07-20 DIAGNOSIS — M5116 Intervertebral disc disorders with radiculopathy, lumbar region: Secondary | ICD-10-CM | POA: Diagnosis not present

## 2018-07-28 DIAGNOSIS — M545 Low back pain: Secondary | ICD-10-CM | POA: Diagnosis not present

## 2018-07-28 DIAGNOSIS — M5432 Sciatica, left side: Secondary | ICD-10-CM | POA: Diagnosis not present

## 2018-07-28 DIAGNOSIS — M6281 Muscle weakness (generalized): Secondary | ICD-10-CM | POA: Diagnosis not present

## 2018-07-28 DIAGNOSIS — M5431 Sciatica, right side: Secondary | ICD-10-CM | POA: Diagnosis not present

## 2018-07-28 DIAGNOSIS — G8921 Chronic pain due to trauma: Secondary | ICD-10-CM | POA: Diagnosis not present

## 2018-08-02 DIAGNOSIS — M6281 Muscle weakness (generalized): Secondary | ICD-10-CM | POA: Diagnosis not present

## 2018-08-02 DIAGNOSIS — G8921 Chronic pain due to trauma: Secondary | ICD-10-CM | POA: Diagnosis not present

## 2018-08-02 DIAGNOSIS — M545 Low back pain: Secondary | ICD-10-CM | POA: Diagnosis not present

## 2018-08-02 DIAGNOSIS — M5432 Sciatica, left side: Secondary | ICD-10-CM | POA: Diagnosis not present

## 2018-08-02 DIAGNOSIS — M5431 Sciatica, right side: Secondary | ICD-10-CM | POA: Diagnosis not present

## 2018-08-07 DIAGNOSIS — M5116 Intervertebral disc disorders with radiculopathy, lumbar region: Secondary | ICD-10-CM | POA: Diagnosis not present

## 2018-08-08 DIAGNOSIS — M5431 Sciatica, right side: Secondary | ICD-10-CM | POA: Diagnosis not present

## 2018-08-08 DIAGNOSIS — M5432 Sciatica, left side: Secondary | ICD-10-CM | POA: Diagnosis not present

## 2018-08-08 DIAGNOSIS — Z23 Encounter for immunization: Secondary | ICD-10-CM | POA: Diagnosis not present

## 2018-08-08 DIAGNOSIS — M6281 Muscle weakness (generalized): Secondary | ICD-10-CM | POA: Diagnosis not present

## 2018-08-08 DIAGNOSIS — M545 Low back pain: Secondary | ICD-10-CM | POA: Diagnosis not present

## 2018-08-08 DIAGNOSIS — G8921 Chronic pain due to trauma: Secondary | ICD-10-CM | POA: Diagnosis not present

## 2018-08-10 DIAGNOSIS — M545 Low back pain: Secondary | ICD-10-CM | POA: Diagnosis not present

## 2018-08-10 DIAGNOSIS — M5431 Sciatica, right side: Secondary | ICD-10-CM | POA: Diagnosis not present

## 2018-08-10 DIAGNOSIS — G8921 Chronic pain due to trauma: Secondary | ICD-10-CM | POA: Diagnosis not present

## 2018-08-10 DIAGNOSIS — M5432 Sciatica, left side: Secondary | ICD-10-CM | POA: Diagnosis not present

## 2018-08-10 DIAGNOSIS — M6281 Muscle weakness (generalized): Secondary | ICD-10-CM | POA: Diagnosis not present

## 2018-08-16 DIAGNOSIS — M5431 Sciatica, right side: Secondary | ICD-10-CM | POA: Diagnosis not present

## 2018-08-16 DIAGNOSIS — G8921 Chronic pain due to trauma: Secondary | ICD-10-CM | POA: Diagnosis not present

## 2018-08-16 DIAGNOSIS — M5432 Sciatica, left side: Secondary | ICD-10-CM | POA: Diagnosis not present

## 2018-08-16 DIAGNOSIS — M6281 Muscle weakness (generalized): Secondary | ICD-10-CM | POA: Diagnosis not present

## 2018-08-16 DIAGNOSIS — M545 Low back pain: Secondary | ICD-10-CM | POA: Diagnosis not present

## 2018-08-18 DIAGNOSIS — M5432 Sciatica, left side: Secondary | ICD-10-CM | POA: Diagnosis not present

## 2018-08-18 DIAGNOSIS — M5431 Sciatica, right side: Secondary | ICD-10-CM | POA: Diagnosis not present

## 2018-08-18 DIAGNOSIS — G8921 Chronic pain due to trauma: Secondary | ICD-10-CM | POA: Diagnosis not present

## 2018-08-18 DIAGNOSIS — M6281 Muscle weakness (generalized): Secondary | ICD-10-CM | POA: Diagnosis not present

## 2018-08-18 DIAGNOSIS — M545 Low back pain: Secondary | ICD-10-CM | POA: Diagnosis not present

## 2018-08-23 DIAGNOSIS — M5432 Sciatica, left side: Secondary | ICD-10-CM | POA: Diagnosis not present

## 2018-08-23 DIAGNOSIS — M545 Low back pain: Secondary | ICD-10-CM | POA: Diagnosis not present

## 2018-08-23 DIAGNOSIS — G8921 Chronic pain due to trauma: Secondary | ICD-10-CM | POA: Diagnosis not present

## 2018-08-23 DIAGNOSIS — M5431 Sciatica, right side: Secondary | ICD-10-CM | POA: Diagnosis not present

## 2018-08-23 DIAGNOSIS — M6281 Muscle weakness (generalized): Secondary | ICD-10-CM | POA: Diagnosis not present

## 2018-08-28 DIAGNOSIS — G894 Chronic pain syndrome: Secondary | ICD-10-CM | POA: Diagnosis not present

## 2018-08-28 DIAGNOSIS — M545 Low back pain: Secondary | ICD-10-CM | POA: Diagnosis not present

## 2018-08-28 DIAGNOSIS — M5116 Intervertebral disc disorders with radiculopathy, lumbar region: Secondary | ICD-10-CM | POA: Diagnosis not present

## 2018-08-30 DIAGNOSIS — M5432 Sciatica, left side: Secondary | ICD-10-CM | POA: Diagnosis not present

## 2018-08-30 DIAGNOSIS — M6281 Muscle weakness (generalized): Secondary | ICD-10-CM | POA: Diagnosis not present

## 2018-08-30 DIAGNOSIS — M5431 Sciatica, right side: Secondary | ICD-10-CM | POA: Diagnosis not present

## 2018-08-30 DIAGNOSIS — G8921 Chronic pain due to trauma: Secondary | ICD-10-CM | POA: Diagnosis not present

## 2018-08-30 DIAGNOSIS — M545 Low back pain: Secondary | ICD-10-CM | POA: Diagnosis not present

## 2018-09-06 DIAGNOSIS — M5432 Sciatica, left side: Secondary | ICD-10-CM | POA: Diagnosis not present

## 2018-09-06 DIAGNOSIS — M545 Low back pain: Secondary | ICD-10-CM | POA: Diagnosis not present

## 2018-09-06 DIAGNOSIS — G8921 Chronic pain due to trauma: Secondary | ICD-10-CM | POA: Diagnosis not present

## 2018-09-06 DIAGNOSIS — M6281 Muscle weakness (generalized): Secondary | ICD-10-CM | POA: Diagnosis not present

## 2018-09-06 DIAGNOSIS — M5431 Sciatica, right side: Secondary | ICD-10-CM | POA: Diagnosis not present

## 2018-09-18 DIAGNOSIS — M47816 Spondylosis without myelopathy or radiculopathy, lumbar region: Secondary | ICD-10-CM | POA: Diagnosis not present

## 2018-10-02 DIAGNOSIS — M47816 Spondylosis without myelopathy or radiculopathy, lumbar region: Secondary | ICD-10-CM | POA: Diagnosis not present

## 2018-10-23 DIAGNOSIS — M47816 Spondylosis without myelopathy or radiculopathy, lumbar region: Secondary | ICD-10-CM | POA: Diagnosis not present

## 2018-10-25 DIAGNOSIS — I10 Essential (primary) hypertension: Secondary | ICD-10-CM | POA: Diagnosis not present

## 2018-10-25 DIAGNOSIS — E871 Hypo-osmolality and hyponatremia: Secondary | ICD-10-CM | POA: Diagnosis not present

## 2018-10-25 DIAGNOSIS — F419 Anxiety disorder, unspecified: Secondary | ICD-10-CM | POA: Diagnosis not present

## 2018-10-25 DIAGNOSIS — E782 Mixed hyperlipidemia: Secondary | ICD-10-CM | POA: Diagnosis not present

## 2018-11-21 DIAGNOSIS — M545 Low back pain: Secondary | ICD-10-CM | POA: Diagnosis not present

## 2018-11-21 DIAGNOSIS — G894 Chronic pain syndrome: Secondary | ICD-10-CM | POA: Diagnosis not present

## 2018-11-21 DIAGNOSIS — M47816 Spondylosis without myelopathy or radiculopathy, lumbar region: Secondary | ICD-10-CM | POA: Diagnosis not present

## 2018-12-07 DIAGNOSIS — G894 Chronic pain syndrome: Secondary | ICD-10-CM | POA: Diagnosis not present

## 2018-12-07 DIAGNOSIS — M7918 Myalgia, other site: Secondary | ICD-10-CM | POA: Diagnosis not present

## 2018-12-07 DIAGNOSIS — M47816 Spondylosis without myelopathy or radiculopathy, lumbar region: Secondary | ICD-10-CM | POA: Diagnosis not present

## 2018-12-12 ENCOUNTER — Other Ambulatory Visit: Payer: Self-pay | Admitting: Rehabilitation

## 2018-12-12 DIAGNOSIS — M47816 Spondylosis without myelopathy or radiculopathy, lumbar region: Secondary | ICD-10-CM | POA: Diagnosis not present

## 2018-12-12 DIAGNOSIS — M170 Bilateral primary osteoarthritis of knee: Secondary | ICD-10-CM | POA: Diagnosis not present

## 2018-12-12 DIAGNOSIS — M5416 Radiculopathy, lumbar region: Secondary | ICD-10-CM

## 2018-12-20 DIAGNOSIS — M170 Bilateral primary osteoarthritis of knee: Secondary | ICD-10-CM | POA: Diagnosis not present

## 2018-12-22 ENCOUNTER — Ambulatory Visit
Admission: RE | Admit: 2018-12-22 | Discharge: 2018-12-22 | Disposition: A | Discharge status: Home / Self Care | Payer: Medicare Other | Source: Ambulatory Visit | Attending: Rehabilitation | Admitting: Rehabilitation

## 2018-12-22 ENCOUNTER — Other Ambulatory Visit: Payer: 59

## 2018-12-22 DIAGNOSIS — M5125 Other intervertebral disc displacement, thoracolumbar region: Secondary | ICD-10-CM | POA: Diagnosis not present

## 2018-12-22 DIAGNOSIS — M5416 Radiculopathy, lumbar region: Secondary | ICD-10-CM

## 2018-12-22 DIAGNOSIS — M4805 Spinal stenosis, thoracolumbar region: Secondary | ICD-10-CM | POA: Diagnosis not present

## 2018-12-27 DIAGNOSIS — M170 Bilateral primary osteoarthritis of knee: Secondary | ICD-10-CM | POA: Diagnosis not present

## 2019-01-01 DIAGNOSIS — M545 Low back pain: Secondary | ICD-10-CM | POA: Diagnosis not present

## 2019-01-01 DIAGNOSIS — G894 Chronic pain syndrome: Secondary | ICD-10-CM | POA: Diagnosis not present

## 2019-01-01 DIAGNOSIS — M47816 Spondylosis without myelopathy or radiculopathy, lumbar region: Secondary | ICD-10-CM | POA: Diagnosis not present

## 2019-01-03 DIAGNOSIS — M170 Bilateral primary osteoarthritis of knee: Secondary | ICD-10-CM | POA: Diagnosis not present

## 2019-06-04 DIAGNOSIS — M1990 Unspecified osteoarthritis, unspecified site: Secondary | ICD-10-CM | POA: Diagnosis not present

## 2019-06-04 DIAGNOSIS — F419 Anxiety disorder, unspecified: Secondary | ICD-10-CM | POA: Diagnosis not present

## 2019-06-04 DIAGNOSIS — K602 Anal fissure, unspecified: Secondary | ICD-10-CM | POA: Diagnosis not present

## 2019-06-04 DIAGNOSIS — I10 Essential (primary) hypertension: Secondary | ICD-10-CM | POA: Diagnosis not present

## 2019-07-10 DIAGNOSIS — M7918 Myalgia, other site: Secondary | ICD-10-CM | POA: Diagnosis not present

## 2019-07-10 DIAGNOSIS — M47816 Spondylosis without myelopathy or radiculopathy, lumbar region: Secondary | ICD-10-CM | POA: Diagnosis not present

## 2019-07-10 DIAGNOSIS — G894 Chronic pain syndrome: Secondary | ICD-10-CM | POA: Diagnosis not present

## 2019-07-10 DIAGNOSIS — M545 Low back pain: Secondary | ICD-10-CM | POA: Diagnosis not present

## 2019-08-02 DIAGNOSIS — M1712 Unilateral primary osteoarthritis, left knee: Secondary | ICD-10-CM | POA: Diagnosis not present

## 2019-08-15 DIAGNOSIS — Z23 Encounter for immunization: Secondary | ICD-10-CM | POA: Diagnosis not present

## 2019-09-17 DIAGNOSIS — M47816 Spondylosis without myelopathy or radiculopathy, lumbar region: Secondary | ICD-10-CM | POA: Diagnosis not present

## 2019-09-17 DIAGNOSIS — M545 Low back pain: Secondary | ICD-10-CM | POA: Diagnosis not present

## 2019-09-17 DIAGNOSIS — G894 Chronic pain syndrome: Secondary | ICD-10-CM | POA: Diagnosis not present

## 2019-10-15 DIAGNOSIS — M47816 Spondylosis without myelopathy or radiculopathy, lumbar region: Secondary | ICD-10-CM | POA: Diagnosis not present

## 2019-10-15 DIAGNOSIS — G894 Chronic pain syndrome: Secondary | ICD-10-CM | POA: Diagnosis not present

## 2019-10-15 DIAGNOSIS — M7918 Myalgia, other site: Secondary | ICD-10-CM | POA: Diagnosis not present

## 2019-10-24 DIAGNOSIS — K4090 Unilateral inguinal hernia, without obstruction or gangrene, not specified as recurrent: Secondary | ICD-10-CM | POA: Diagnosis not present

## 2019-10-29 ENCOUNTER — Ambulatory Visit: Payer: Self-pay | Admitting: Surgery

## 2019-10-29 DIAGNOSIS — K4090 Unilateral inguinal hernia, without obstruction or gangrene, not specified as recurrent: Secondary | ICD-10-CM | POA: Diagnosis not present

## 2019-10-29 NOTE — H&P (Signed)
[name] [name] Documented: 10/29/2019 10:05 AM Location: [facility] Patient #: [id number] DOB: 05/31/1951 Married / Language: Cleophus Molt / Race: White Male  History of Present Illness [name] [name]; 10/29/2019 12:17 PM) Patient words: Patient sent at the request of Peggye Pitt PA for history of left groin pain. The patient is a multi-month history of left groin pain and bulge. This is become more frequent over the last 4 weeks and he recently noticed a bulge in his left groin while showering. The pain is described as burning in heavy in nature with location and left groin region without significant radiation. It is made worse with exertion. It seems to improve with recumbency.  The patient is a 68 year old male.   Past Surgical History Emeline Gins, [place]; 10/29/2019 10:05 AM) Colon Polyp Removal - Colonoscopy Hemorrhoidectomy  Diagnostic Studies History Emeline Gins, [place]; 10/29/2019 10:05 AM) Colonoscopy >10 years ago  Allergies Emeline Gins, CMA; 10/29/2019 10:06 AM) No Known Drug Allergies [10/29/2019]: Allergies Reconciled  Medication History Emeline Gins, CMA; 10/29/2019 10:07 AM) Gabapentin (600MG  Tablet, Oral) Active. Baclofen (20MG  Tablet, Oral) Active. Diclofenac Sodium (50MG  Tablet DR, Oral) Active. Atorvastatin Calcium (20MG  Tablet, Oral) Active. Lisinopril (20MG  Tablet, Oral) Active. ALPRAZolam (0.25MG  Tablet, Oral) Active. NexIUM (10MG  Packet, Oral) Active. B Complex (Oral) Active. Stool Softener (100MG  Tablet, Oral) Active. Medications Reconciled  Social History Emeline Gins, [place]; 10/29/2019 10:05 AM) Caffeine use Coffee.  Family History Emeline Gins, [place]; 10/29/2019 10:05 AM) Hypertension Sister.  Other Problems Emeline Gins, [place]; 10/29/2019 10:05 AM) Depression Gastroesophageal Reflux Disease Hemorrhoids High blood pressure Hypercholesterolemia Inguinal Hernia     Review of Systems  Emeline Gins CMA; 10/29/2019 10:05 AM) General Present- Weight Loss. Not Present- Appetite Loss, Chills, Fatigue, Fever, Night Sweats and Weight Gain. Skin Not Present- Change in Wart/Mole, Dryness, Hives, Jaundice, New Lesions, Non-Healing Wounds, Rash and Ulcer. HEENT Present- Wears glasses/contact lenses. Not Present- Earache, Hearing Loss, Hoarseness, Nose Bleed, Oral Ulcers, Ringing in the Ears, Seasonal Allergies, Sinus Pain, Sore Throat, Visual Disturbances and Yellow Eyes. Male Genitourinary Not Present- Blood in Urine, Change in Urinary Stream, Frequency, Impotence, Nocturia, Painful Urination, Urgency and Urine Leakage. Musculoskeletal Present- Back Pain. Not Present- Joint Pain, Joint Stiffness, Muscle Pain, Muscle Weakness and Swelling of Extremities. Neurological Present- Trouble walking. Not Present- Decreased Memory, Fainting, Headaches, Numbness, Seizures, Tingling, Tremor and Weakness. Psychiatric Present- Anxiety and Fearful. Not Present- Bipolar, Change in Sleep Pattern, Depression and Frequent crying. Hematology Not Present- Blood Thinners, Easy Bruising, Excessive bleeding, Gland problems, HIV and Persistent Infections.  Vitals Emeline Gins CMA; 10/29/2019 10:06 AM) 10/29/2019 10:05 AM Weight: 136.6 lb Height: 65in Body Surface Area: 1.68 m Body Mass Index: 22.73 kg/m  Temp.: 98.69F  Pulse: 67 (Regular)  BP: 124/78 (Sitting, Left Arm, Standard)        Physical Exam ([name] [name]; 10/29/2019 12:19 PM)  General Mental Status-Alert. General Appearance-Consistent with stated age. Hydration-Well hydrated. Voice-Normal.  Eye Eyeball - Bilateral-Extraocular movements intact. Sclera/Conjunctiva - Bilateral-No scleral icterus.  Chest and Lung Exam Note: WOB normal no wheezing  Cardiovascular Note: NSR  Abdomen Note: Reducible left inguinal hernia. No evidence of right inguinal hernia. Soft nontender without rebound or  guarding  Neurologic Neurologic evaluation reveals -alert and oriented x 3 with no impairment of recent or remote memory. Mental Status-Normal.  Musculoskeletal Normal Exam - Left-Upper Extremity Strength Normal and Lower Extremity Strength Normal. Normal Exam - Right-Upper Extremity Strength Normal and Lower Extremity Strength Normal.  Assessment & Plan ([name] [name]; 10/29/2019 12:14 PM)  LEFT INGUINAL HERNIA (K40.90) Impression: Discussed laparoscopic and open techniques in the use of mesh. The pros and cons of each technique and potential pros and cons of mesh use discuss today. He is opted for open repair of his left inguinal hernia with mesh. The risk of hernia repair include bleeding, infection, organ injury, bowel injury, bladder injury, nerve injury recurrent hernia, blood clots, worsening of underlying condition, chronic pain, mesh use, open surgery, death, and the need for other operattons. Pt agrees to proceed  Current Plans You are being scheduled for surgery- Our schedulers will call you.  You should hear from our office's scheduling department within 5 working days about the location, date, and time of surgery. We try to make accommodations for patient's preferences in scheduling surgery, but sometimes the OR schedule or the surgeon's schedule prevents [place] from making those accommodations.  If you have not heard from our office [phone]) in 5 working days, call the office and ask for your surgeon's nurse.  If you have other questions about your diagnosis, plan, or surgery, call the office and ask for your surgeon's nurse.  Pt Education - Pamphlet Given - Hernia Surgery: discussed with patient and provided information. The anatomy & physiology of the abdominal wall and pelvic floor was discussed. The pathophysiology of hernias in the inguinal and pelvic region was discussed. Natural history risks such as progressive enlargement, pain, incarceration,  and strangulation was discussed. Contributors to complications such as smoking, obesity, diabetes, prior surgery, etc were discussed.  I feel the risks of no intervention will lead to serious problems that outweigh the operative risks; therefore, I recommended surgery to reduce and repair the hernia. I explained an open approach. I noted usual use of mesh to patch and/or buttress hernia repair  Risks such as bleeding, infection, abscess, need for further treatment, heart attack, death, and other risks were discussed. I noted a good likelihood this will help address the problem. Goals of post-operative recovery were discussed as well. Possibility that this will not correct all symptoms was explained. I stressed the importance of low-impact activity, aggressive pain control, avoiding constipation, & not pushing through pain to minimize risk of post-operative chronic pain or injury. Possibility of reherniation was discussed. We will work to minimize complications.  An educational handout further explaining the pathology & treatment options was given as well. Questions were answered. The patient expresses understanding & wishes to proceed with surgery.  Pt Education - CCS Mesh education: discussed with patient and provided information.

## 2019-10-29 NOTE — H&P (View-Only) (Signed)
Jason Coop Documented: 10/29/2019 10:05 AM Location: [facility] Patient #: [id number] DOB: [birth year] Married / Language: Cleophus Molt / Race: White Male  History of Present Illness Marcello Moores A. Herbert Aguinaldo MD; 10/29/2019 12:17 PM) Patient words: Patient sent at the request of Peggye Pitt PA for history of left groin pain. The patient is a multi-month history of left groin pain and bulge. This is become more frequent over the last 4 weeks and he recently noticed a bulge in his left groin while showering. The pain is described as burning in heavy in nature with location and left groin region without significant radiation. It is made worse with exertion. It seems to improve with recumbency.  The patient is a 68 year old male.   Past Surgical History Emeline Gins, [place]; 10/29/2019 10:05 AM) Colon Polyp Removal - Colonoscopy Hemorrhoidectomy  Diagnostic Studies History Emeline Gins, [place]; 10/29/2019 10:05 AM) Colonoscopy >10 years ago  Allergies Emeline Gins, CMA; 10/29/2019 10:06 AM) No Known Drug Allergies [10/29/2019]: Allergies Reconciled  Medication History Emeline Gins, CMA; 10/29/2019 10:07 AM) Gabapentin (600MG  Tablet, Oral) Active. Baclofen (20MG  Tablet, Oral) Active. Diclofenac Sodium (50MG  Tablet DR, Oral) Active. Atorvastatin Calcium (20MG  Tablet, Oral) Active. Lisinopril (20MG  Tablet, Oral) Active. ALPRAZolam (0.25MG  Tablet, Oral) Active. NexIUM (10MG  Packet, Oral) Active. B Complex (Oral) Active. Stool Softener (100MG  Tablet, Oral) Active. Medications Reconciled  Social History Emeline Gins, [place]; 10/29/2019 10:05 AM) Caffeine use Coffee.  Family History Emeline Gins, [place]; 10/29/2019 10:05 AM) Hypertension Sister.  Other Problems Emeline Gins, [place]; 10/29/2019 10:05 AM) Depression Gastroesophageal Reflux Disease Hemorrhoids High blood pressure Hypercholesterolemia Inguinal Hernia     Review of Systems  Emeline Gins CMA; 10/29/2019 10:05 AM) General Present- Weight Loss. Not Present- Appetite Loss, Chills, Fatigue, Fever, Night Sweats and Weight Gain. Skin Not Present- Change in Wart/Mole, Dryness, Hives, Jaundice, New Lesions, Non-Healing Wounds, Rash and Ulcer. HEENT Present- Wears glasses/contact lenses. Not Present- Earache, Hearing Loss, Hoarseness, Nose Bleed, Oral Ulcers, Ringing in the Ears, Seasonal Allergies, Sinus Pain, Sore Throat, Visual Disturbances and Yellow Eyes. Male Genitourinary Not Present- Blood in Urine, Change in Urinary Stream, Frequency, Impotence, Nocturia, Painful Urination, Urgency and Urine Leakage. Musculoskeletal Present- Back Pain. Not Present- Joint Pain, Joint Stiffness, Muscle Pain, Muscle Weakness and Swelling of Extremities. Neurological Present- Trouble walking. Not Present- Decreased Memory, Fainting, Headaches, Numbness, Seizures, Tingling, Tremor and Weakness. Psychiatric Present- Anxiety and Fearful. Not Present- Bipolar, Change in Sleep Pattern, Depression and Frequent crying. Hematology Not Present- Blood Thinners, Easy Bruising, Excessive bleeding, Gland problems, HIV and Persistent Infections.  Vitals Emeline Gins CMA; 10/29/2019 10:06 AM) 10/29/2019 10:05 AM Weight: 136.6 lb Height: 65in Body Surface Area: 1.68 m Body Mass Index: 22.73 kg/m  Temp.: 98.66F  Pulse: 67 (Regular)  BP: 124/78 (Sitting, Left Arm, Standard)        Physical Exam (Katheleen Stella A. Rees Santistevan MD; 10/29/2019 12:19 PM)  General Mental Status-Alert. General Appearance-Consistent with stated age. Hydration-Well hydrated. Voice-Normal.  Eye Eyeball - Bilateral-Extraocular movements intact. Sclera/Conjunctiva - Bilateral-No scleral icterus.  Chest and Lung Exam Note: WOB normal no wheezing  Cardiovascular Note: NSR  Abdomen Note: Reducible left inguinal hernia. No evidence of right inguinal hernia. Soft nontender without rebound or  guarding  Neurologic Neurologic evaluation reveals -alert and oriented x 3 with no impairment of recent or remote memory. Mental Status-Normal.  Musculoskeletal Normal Exam - Left-Upper Extremity Strength Normal and Lower Extremity Strength Normal. Normal Exam - Right-Upper Extremity Strength Normal and Lower Extremity Strength Normal.  Assessment & Plan (Micharl Helmes A. Inza Mikrut MD; 10/29/2019 12:14 PM)  LEFT INGUINAL HERNIA (K40.90) Impression: Discussed laparoscopic and open techniques in the use of mesh. The pros and cons of each technique and potential pros and cons of mesh use discuss today. He is opted for open repair of his left inguinal hernia with mesh. The risk of hernia repair include bleeding, infection, organ injury, bowel injury, bladder injury, nerve injury recurrent hernia, blood clots, worsening of underlying condition, chronic pain, mesh use, open surgery, death, and the need for other operattons. Pt agrees to proceed  Current Plans You are being scheduled for surgery- Our schedulers will call you.  You should hear from our office's scheduling department within 5 working days about the location, date, and time of surgery. We try to make accommodations for patient's preferences in scheduling surgery, but sometimes the OR schedule or the surgeon's schedule prevents [place] from making those accommodations.  If you have not heard from our office [phone]) in 5 working days, call the office and ask for your surgeon's nurse.  If you have other questions about your diagnosis, plan, or surgery, call the office and ask for your surgeon's nurse.  Pt Education - Pamphlet Given - Hernia Surgery: discussed with patient and provided information. The anatomy & physiology of the abdominal wall and pelvic floor was discussed. The pathophysiology of hernias in the inguinal and pelvic region was discussed. Natural history risks such as progressive enlargement, pain, incarceration,  and strangulation was discussed. Contributors to complications such as smoking, obesity, diabetes, prior surgery, etc were discussed.  I feel the risks of no intervention will lead to serious problems that outweigh the operative risks; therefore, I recommended surgery to reduce and repair the hernia. I explained an open approach. I noted usual use of mesh to patch and/or buttress hernia repair  Risks such as bleeding, infection, abscess, need for further treatment, heart attack, death, and other risks were discussed. I noted a good likelihood this will help address the problem. Goals of post-operative recovery were discussed as well. Possibility that this will not correct all symptoms was explained. I stressed the importance of low-impact activity, aggressive pain control, avoiding constipation, & not pushing through pain to minimize risk of post-operative chronic pain or injury. Possibility of reherniation was discussed. We will work to minimize complications.  An educational handout further explaining the pathology & treatment options was given as well. Questions were answered. The patient expresses understanding & wishes to proceed with surgery.  Pt Education - [facility] Mesh education: discussed with patient and provided information.

## 2019-10-31 DIAGNOSIS — M5416 Radiculopathy, lumbar region: Secondary | ICD-10-CM | POA: Diagnosis not present

## 2019-11-14 ENCOUNTER — Encounter (HOSPITAL_BASED_OUTPATIENT_CLINIC_OR_DEPARTMENT_OTHER): Payer: Self-pay | Admitting: Surgery

## 2019-11-14 ENCOUNTER — Other Ambulatory Visit: Payer: Self-pay

## 2019-11-21 ENCOUNTER — Other Ambulatory Visit: Payer: Self-pay

## 2019-11-21 ENCOUNTER — Encounter (HOSPITAL_BASED_OUTPATIENT_CLINIC_OR_DEPARTMENT_OTHER)
Admission: RE | Admit: 2019-11-21 | Discharge: 2019-11-21 | Disposition: A | Discharge status: Home / Self Care | Payer: Medicare Other | Source: Ambulatory Visit | Attending: Surgery | Admitting: Surgery

## 2019-11-21 DIAGNOSIS — I10 Essential (primary) hypertension: Secondary | ICD-10-CM | POA: Diagnosis not present

## 2019-11-21 DIAGNOSIS — E7800 Pure hypercholesterolemia, unspecified: Secondary | ICD-10-CM | POA: Diagnosis not present

## 2019-11-21 DIAGNOSIS — Z791 Long term (current) use of non-steroidal anti-inflammatories (NSAID): Secondary | ICD-10-CM | POA: Diagnosis not present

## 2019-11-21 DIAGNOSIS — K219 Gastro-esophageal reflux disease without esophagitis: Secondary | ICD-10-CM | POA: Diagnosis not present

## 2019-11-21 DIAGNOSIS — Z79899 Other long term (current) drug therapy: Secondary | ICD-10-CM | POA: Diagnosis not present

## 2019-11-21 DIAGNOSIS — K4090 Unilateral inguinal hernia, without obstruction or gangrene, not specified as recurrent: Secondary | ICD-10-CM | POA: Diagnosis present

## 2019-11-21 DIAGNOSIS — Z87891 Personal history of nicotine dependence: Secondary | ICD-10-CM | POA: Diagnosis not present

## 2019-11-21 DIAGNOSIS — F329 Major depressive disorder, single episode, unspecified: Secondary | ICD-10-CM | POA: Diagnosis not present

## 2019-11-21 LAB — CBC WITH DIFFERENTIAL/PLATELET
Abs Immature Granulocytes: 0.02 10*3/uL (ref 0.00–0.07)
Basophils Absolute: 0.1 10*3/uL (ref 0.0–0.1)
Basophils Relative: 1 %
Eosinophils Absolute: 0.1 10*3/uL (ref 0.0–0.5)
Eosinophils Relative: 2 %
HCT: 39.1 % (ref 39.0–52.0)
Hemoglobin: 13.7 g/dL (ref 13.0–17.0)
Immature Granulocytes: 0 %
Lymphocytes Relative: 17 %
Lymphs Abs: 1.2 10*3/uL (ref 0.7–4.0)
MCH: 32.2 pg (ref 26.0–34.0)
MCHC: 35 g/dL (ref 30.0–36.0)
MCV: 92 fL (ref 80.0–100.0)
Monocytes Absolute: 0.9 10*3/uL (ref 0.1–1.0)
Monocytes Relative: 12 %
Neutro Abs: 5.1 10*3/uL (ref 1.7–7.7)
Neutrophils Relative %: 68 %
Platelets: 308 10*3/uL (ref 150–400)
RBC: 4.25 MIL/uL (ref 4.22–5.81)
RDW: 13.1 % (ref 11.5–15.5)
WBC: 7.4 10*3/uL (ref 4.0–10.5)
nRBC: 0 % (ref 0.0–0.2)

## 2019-11-21 LAB — COMPREHENSIVE METABOLIC PANEL
ALT: 44 U/L (ref 0–44)
AST: 32 U/L (ref 15–41)
Albumin: 4.1 g/dL (ref 3.5–5.0)
Alkaline Phosphatase: 60 U/L (ref 38–126)
Anion gap: 11 (ref 5–15)
BUN: 13 mg/dL (ref 8–23)
CO2: 27 mmol/L (ref 22–32)
Calcium: 9.4 mg/dL (ref 8.9–10.3)
Chloride: 91 mmol/L — ABNORMAL LOW (ref 98–111)
Creatinine, Ser: 0.92 mg/dL (ref 0.61–1.24)
GFR calc Af Amer: >60 mL/min (ref >60)
GFR calc non Af Amer: >60 mL/min (ref >60)
Glucose, Bld: 96 mg/dL (ref 70–99)
Potassium: 4.4 mmol/L (ref 3.5–5.1)
Sodium: 129 mmol/L — ABNORMAL LOW (ref 135–145)
Total Bilirubin: 0.4 mg/dL (ref 0.3–1.2)
Total Protein: 6.6 g/dL (ref 6.5–8.1)

## 2019-11-21 NOTE — Progress Notes (Signed)

## 2019-11-21 NOTE — Progress Notes (Signed)
EKG done today and pt's last EKG reviewed by Dr. Marcie Bal, pt denies any discomfort or SOB, will proceed with surgery as scheduled.

## 2019-11-24 ENCOUNTER — Other Ambulatory Visit (HOSPITAL_COMMUNITY)
Admission: RE | Admit: 2019-11-24 | Discharge: 2019-11-24 | Disposition: A | Discharge status: Home / Self Care | Payer: Medicare Other | Source: Ambulatory Visit | Attending: Surgery | Admitting: Surgery

## 2019-11-24 DIAGNOSIS — Z20822 Contact with and (suspected) exposure to covid-19: Secondary | ICD-10-CM | POA: Diagnosis not present

## 2019-11-24 DIAGNOSIS — Z01812 Encounter for preprocedural laboratory examination: Secondary | ICD-10-CM | POA: Insufficient documentation

## 2019-11-25 LAB — SARS CORONAVIRUS 2 (TAT 6-24 HRS): SARS Coronavirus 2: NEGATIVE

## 2019-11-27 ENCOUNTER — Ambulatory Visit (HOSPITAL_BASED_OUTPATIENT_CLINIC_OR_DEPARTMENT_OTHER): Payer: Medicare Other | Admitting: Anesthesiology

## 2019-11-27 ENCOUNTER — Encounter (HOSPITAL_BASED_OUTPATIENT_CLINIC_OR_DEPARTMENT_OTHER)
Admission: RE | Disposition: A | Discharge status: Home / Self Care | Payer: Self-pay | Source: Home / Self Care | Attending: Surgery

## 2019-11-27 ENCOUNTER — Encounter (HOSPITAL_BASED_OUTPATIENT_CLINIC_OR_DEPARTMENT_OTHER): Payer: Self-pay | Admitting: Surgery

## 2019-11-27 ENCOUNTER — Ambulatory Visit (HOSPITAL_BASED_OUTPATIENT_CLINIC_OR_DEPARTMENT_OTHER)
Admission: RE | Admit: 2019-11-27 | Discharge: 2019-11-27 | Disposition: A | Discharge status: Home / Self Care | Payer: Medicare Other | Attending: Surgery | Admitting: Surgery

## 2019-11-27 DIAGNOSIS — Z791 Long term (current) use of non-steroidal anti-inflammatories (NSAID): Secondary | ICD-10-CM | POA: Diagnosis not present

## 2019-11-27 DIAGNOSIS — E7800 Pure hypercholesterolemia, unspecified: Secondary | ICD-10-CM | POA: Insufficient documentation

## 2019-11-27 DIAGNOSIS — Z79899 Other long term (current) drug therapy: Secondary | ICD-10-CM | POA: Diagnosis not present

## 2019-11-27 DIAGNOSIS — I10 Essential (primary) hypertension: Secondary | ICD-10-CM | POA: Insufficient documentation

## 2019-11-27 DIAGNOSIS — K4090 Unilateral inguinal hernia, without obstruction or gangrene, not specified as recurrent: Secondary | ICD-10-CM | POA: Insufficient documentation

## 2019-11-27 DIAGNOSIS — K219 Gastro-esophageal reflux disease without esophagitis: Secondary | ICD-10-CM | POA: Insufficient documentation

## 2019-11-27 DIAGNOSIS — F329 Major depressive disorder, single episode, unspecified: Secondary | ICD-10-CM | POA: Diagnosis not present

## 2019-11-27 DIAGNOSIS — Z87891 Personal history of nicotine dependence: Secondary | ICD-10-CM | POA: Insufficient documentation

## 2019-11-27 HISTORY — PX: INGUINAL HERNIA REPAIR: SHX194

## 2019-11-27 SURGERY — REPAIR, HERNIA, INGUINAL, ADULT
Anesthesia: General | Site: Groin | Laterality: Left

## 2019-11-27 MED ORDER — CEFAZOLIN SODIUM-DEXTROSE 2-4 GM/100ML-% IV SOLN
2.0000 g | INTRAVENOUS | Status: AC
  Administered 2019-11-27: 2 g via INTRAVENOUS

## 2019-11-27 MED ORDER — BUPIVACAINE-EPINEPHRINE (PF) 0.5% -1:200000 IJ SOLN
INTRAMUSCULAR | Status: DC | PRN
  Administered 2019-11-27 (×6): 5 mL via PERINEURAL

## 2019-11-27 MED ORDER — SUGAMMADEX SODIUM 200 MG/2ML IV SOLN
INTRAVENOUS | Status: DC | PRN
  Administered 2019-11-27: 130 mg via INTRAVENOUS

## 2019-11-27 MED ORDER — ACETAMINOPHEN 500 MG PO TABS
ORAL_TABLET | ORAL | Status: AC
  Filled 2019-11-27: qty 2

## 2019-11-27 MED ORDER — ACETAMINOPHEN 325 MG PO TABS: 325.0000 mg | ORAL_TABLET | ORAL | Status: DC | PRN

## 2019-11-27 MED ORDER — OXYCODONE HCL 5 MG/5ML PO SOLN: 5.0000 mg | Freq: Once | ORAL | Status: DC | PRN

## 2019-11-27 MED ORDER — KETOROLAC TROMETHAMINE 30 MG/ML IJ SOLN
INTRAMUSCULAR | Status: AC
  Filled 2019-11-27: qty 1

## 2019-11-27 MED ORDER — OXYCODONE HCL 5 MG PO TABS: 5.0000 mg | ORAL_TABLET | Freq: Once | ORAL | Status: DC | PRN

## 2019-11-27 MED ORDER — GABAPENTIN 300 MG PO CAPS: 300.0000 mg | ORAL_CAPSULE | ORAL | Status: DC

## 2019-11-27 MED ORDER — KETOROLAC TROMETHAMINE 15 MG/ML IJ SOLN
15.0000 mg | Freq: Once | INTRAMUSCULAR | Status: AC
  Administered 2019-11-27: 13:00:00 15 mg via INTRAVENOUS

## 2019-11-27 MED ORDER — CHLORHEXIDINE GLUCONATE CLOTH 2 % EX PADS: 6.0000 | MEDICATED_PAD | Freq: Once | CUTANEOUS | Status: DC

## 2019-11-27 MED ORDER — FENTANYL CITRATE (PF) 100 MCG/2ML IJ SOLN
50.0000 ug | INTRAMUSCULAR | Status: DC | PRN
  Administered 2019-11-27: 100 ug via INTRAVENOUS

## 2019-11-27 MED ORDER — FENTANYL CITRATE (PF) 100 MCG/2ML IJ SOLN: 50.0000 ug | INTRAMUSCULAR | Status: DC | PRN

## 2019-11-27 MED ORDER — ACETAMINOPHEN 160 MG/5ML PO SOLN: 325.0000 mg | ORAL | Status: DC | PRN

## 2019-11-27 MED ORDER — MIDAZOLAM HCL 2 MG/2ML IJ SOLN
INTRAMUSCULAR | Status: AC
  Filled 2019-11-27: qty 2

## 2019-11-27 MED ORDER — DEXAMETHASONE SODIUM PHOSPHATE 4 MG/ML IJ SOLN
INTRAMUSCULAR | Status: DC | PRN
  Administered 2019-11-27: 5 mg via INTRAVENOUS

## 2019-11-27 MED ORDER — FENTANYL CITRATE (PF) 100 MCG/2ML IJ SOLN: 25.0000 ug | INTRAMUSCULAR | Status: DC | PRN

## 2019-11-27 MED ORDER — ONDANSETRON HCL 4 MG/2ML IJ SOLN
INTRAMUSCULAR | Status: AC
  Filled 2019-11-27: qty 2

## 2019-11-27 MED ORDER — LIDOCAINE HCL (CARDIAC) PF 100 MG/5ML IV SOSY
PREFILLED_SYRINGE | INTRAVENOUS | Status: DC | PRN
  Administered 2019-11-27: 100 mg via INTRAVENOUS

## 2019-11-27 MED ORDER — ACETAMINOPHEN 500 MG PO TABS
1000.0000 mg | ORAL_TABLET | ORAL | Status: AC
  Administered 2019-11-27: 10:00:00 1000 mg via ORAL

## 2019-11-27 MED ORDER — CLONIDINE HCL (ANALGESIA) 100 MCG/ML EP SOLN
EPIDURAL | Status: DC | PRN
  Administered 2019-11-27: 100 ug

## 2019-11-27 MED ORDER — LACTATED RINGERS IV SOLN: INTRAVENOUS | Status: DC

## 2019-11-27 MED ORDER — BUPIVACAINE HCL (PF) 0.25 % IJ SOLN
INTRAMUSCULAR | Status: DC | PRN
  Administered 2019-11-27: 6 mL

## 2019-11-27 MED ORDER — ROCURONIUM BROMIDE 10 MG/ML (PF) SYRINGE
PREFILLED_SYRINGE | INTRAVENOUS | Status: AC
  Filled 2019-11-27: qty 10

## 2019-11-27 MED ORDER — MIDAZOLAM HCL 2 MG/2ML IJ SOLN
1.0000 mg | INTRAMUSCULAR | Status: DC | PRN
  Administered 2019-11-27: 11:00:00 2 mg via INTRAVENOUS

## 2019-11-27 MED ORDER — EPHEDRINE SULFATE 50 MG/ML IJ SOLN
INTRAMUSCULAR | Status: DC | PRN
  Administered 2019-11-27: 15 mg via INTRAVENOUS

## 2019-11-27 MED ORDER — ONDANSETRON HCL 4 MG/2ML IJ SOLN: 4.0000 mg | Freq: Once | INTRAMUSCULAR | Status: DC | PRN

## 2019-11-27 MED ORDER — OXYCODONE HCL 5 MG PO TABS: 5.0000 mg | ORAL_TABLET | Freq: Four times a day (QID) | ORAL | 0 refills | Status: DC | PRN

## 2019-11-27 MED ORDER — EPHEDRINE 5 MG/ML INJ
INTRAVENOUS | Status: AC
  Filled 2019-11-27: qty 10

## 2019-11-27 MED ORDER — FENTANYL CITRATE (PF) 100 MCG/2ML IJ SOLN
INTRAMUSCULAR | Status: AC
  Filled 2019-11-27: qty 2

## 2019-11-27 MED ORDER — IBUPROFEN 800 MG PO TABS: 800.0000 mg | ORAL_TABLET | Freq: Three times a day (TID) | ORAL | 0 refills | Status: DC | PRN

## 2019-11-27 MED ORDER — MEPERIDINE HCL 25 MG/ML IJ SOLN: 6.2500 mg | INTRAMUSCULAR | Status: DC | PRN

## 2019-11-27 MED ORDER — ROCURONIUM BROMIDE 100 MG/10ML IV SOLN
INTRAVENOUS | Status: DC | PRN
  Administered 2019-11-27: 70 mg via INTRAVENOUS

## 2019-11-27 MED ORDER — PROPOFOL 10 MG/ML IV BOLUS
INTRAVENOUS | Status: DC | PRN
  Administered 2019-11-27: 170 mg via INTRAVENOUS

## 2019-11-27 MED ORDER — CEFAZOLIN SODIUM-DEXTROSE 2-4 GM/100ML-% IV SOLN
INTRAVENOUS | Status: AC
  Filled 2019-11-27: qty 100

## 2019-11-27 MED ORDER — DEXAMETHASONE SODIUM PHOSPHATE 10 MG/ML IJ SOLN
INTRAMUSCULAR | Status: AC
  Filled 2019-11-27: qty 1

## 2019-11-27 MED ORDER — ONDANSETRON HCL 4 MG/2ML IJ SOLN
INTRAMUSCULAR | Status: DC | PRN
  Administered 2019-11-27: 4 mg via INTRAVENOUS

## 2019-11-27 MED ORDER — LIDOCAINE 2% (20 MG/ML) 5 ML SYRINGE
INTRAMUSCULAR | Status: AC
  Filled 2019-11-27: qty 5

## 2019-11-27 MED ORDER — KETOROLAC TROMETHAMINE 30 MG/ML IJ SOLN
INTRAMUSCULAR | Status: DC | PRN
  Administered 2019-11-27: 15 mg via INTRAVENOUS

## 2019-11-27 SURGICAL SUPPLY — 53 items
BLADE CLIPPER SURG (BLADE) ×2 IMPLANT
BLADE SURG 15 STRL LF DISP TIS (BLADE) ×1 IMPLANT
BLADE SURG 15 STRL SS (BLADE) ×1
CANISTER SUCT 1200ML W/VALVE (MISCELLANEOUS) IMPLANT
CHLORAPREP W/TINT 26 (MISCELLANEOUS) ×2 IMPLANT
COVER BACK TABLE REUSABLE LG (DRAPES) ×2 IMPLANT
COVER MAYO STAND REUSABLE (DRAPES) ×2 IMPLANT
COVER WAND RF STERILE (DRAPES) IMPLANT
DECANTER SPIKE VIAL GLASS SM (MISCELLANEOUS) IMPLANT
DERMABOND ADVANCED (GAUZE/BANDAGES/DRESSINGS) ×1
DERMABOND ADVANCED .7 DNX12 (GAUZE/BANDAGES/DRESSINGS) ×1 IMPLANT
DRAIN PENROSE 1/2X12 LTX STRL (WOUND CARE) ×2 IMPLANT
DRAPE LAPAROTOMY TRNSV 102X78 (DRAPES) ×2 IMPLANT
DRAPE UTILITY XL STRL (DRAPES) ×2 IMPLANT
ELECT COATED BLADE 2.86 ST (ELECTRODE) ×2 IMPLANT
ELECT REM PT RETURN 9FT ADLT (ELECTROSURGICAL) ×2
ELECTRODE REM PT RTRN 9FT ADLT (ELECTROSURGICAL) ×1 IMPLANT
GAUZE 4X4 16PLY RFD (DISPOSABLE) IMPLANT
GAUZE SPONGE 4X4 12PLY STRL LF (GAUZE/BANDAGES/DRESSINGS) IMPLANT
GLOVE BIO SURGEON STRL SZ7 (GLOVE) ×2 IMPLANT
GLOVE BIOGEL PI IND STRL 7.0 (GLOVE) ×2 IMPLANT
GLOVE BIOGEL PI IND STRL 8 (GLOVE) ×1 IMPLANT
GLOVE BIOGEL PI INDICATOR 7.0 (GLOVE) ×2
GLOVE BIOGEL PI INDICATOR 8 (GLOVE) ×1
GLOVE ECLIPSE 6.5 STRL STRAW (GLOVE) ×2 IMPLANT
GLOVE ECLIPSE 8.0 STRL XLNG CF (GLOVE) ×2 IMPLANT
GLOVE EXAM NITRILE MD LF STRL (GLOVE) ×2 IMPLANT
GOWN STRL REUS W/ TWL LRG LVL3 (GOWN DISPOSABLE) ×2 IMPLANT
GOWN STRL REUS W/ TWL XL LVL3 (GOWN DISPOSABLE) ×1 IMPLANT
GOWN STRL REUS W/TWL LRG LVL3 (GOWN DISPOSABLE) ×2
GOWN STRL REUS W/TWL XL LVL3 (GOWN DISPOSABLE) ×1
MESH HERNIA SYS ULTRAPRO LRG (Mesh General) ×2 IMPLANT
NEEDLE HYPO 25X1 1.5 SAFETY (NEEDLE) ×2 IMPLANT
NS IRRIG 1000ML POUR BTL (IV SOLUTION) ×2 IMPLANT
PACK BASIN DAY SURGERY FS (CUSTOM PROCEDURE TRAY) ×2 IMPLANT
PENCIL SMOKE EVACUATOR (MISCELLANEOUS) ×2 IMPLANT
SLEEVE SCD COMPRESS KNEE MED (MISCELLANEOUS) ×2 IMPLANT
SPONGE LAP 4X18 RFD (DISPOSABLE) ×2 IMPLANT
STRIP CLOSURE SKIN 1/2X4 (GAUZE/BANDAGES/DRESSINGS) IMPLANT
SUT MON AB 4-0 PC3 18 (SUTURE) ×2 IMPLANT
SUT NOVA 0 T19/GS 22DT (SUTURE) IMPLANT
SUT NOVA NAB DX-16 0-1 5-0 T12 (SUTURE) ×4 IMPLANT
SUT VIC AB 2-0 SH 27 (SUTURE) ×1
SUT VIC AB 2-0 SH 27XBRD (SUTURE) ×1 IMPLANT
SUT VIC AB 3-0 54X BRD REEL (SUTURE) IMPLANT
SUT VIC AB 3-0 BRD 54 (SUTURE)
SUT VICRYL 3-0 CR8 SH (SUTURE) ×2 IMPLANT
SUT VICRYL AB 2 0 TIE (SUTURE) IMPLANT
SUT VICRYL AB 2 0 TIES (SUTURE)
SYR CONTROL 10ML LL (SYRINGE) ×2 IMPLANT
TOWEL GREEN STERILE FF (TOWEL DISPOSABLE) ×2 IMPLANT
TUBE CONNECTING 20X1/4 (TUBING) IMPLANT
YANKAUER SUCT BULB TIP NO VENT (SUCTIONS) IMPLANT

## 2019-11-27 NOTE — Transfer of Care (Signed)
Immediate Anesthesia Transfer of Care Note  Patient: [name] [name]  Procedure(s) Performed: LEFT INGUINAL HERNIA REPAIR WITH MESH (Left Groin)  Patient Location: PACU  Anesthesia Type:General  Level of Consciousness: awake, alert  and oriented  Airway & Oxygen Therapy: Patient Spontanous Breathing and Patient connected to nasal cannula oxygen  Post-op Assessment: Report given to RN and Post -op Vital signs reviewed and stable  Post vital signs: Reviewed and stable  Last Vitals:  Vitals Value Taken Time  BP 132/71 11/27/19 1201  Temp    Pulse 78 11/27/19 1203  Resp 17 11/27/19 1203  SpO2 100 % 11/27/19 1203  Vitals shown include unvalidated device data.  Last Pain:  Vitals:   11/27/19 1013  TempSrc: Temporal  PainSc: 3          Complications: No apparent anesthesia complications

## 2019-11-27 NOTE — Anesthesia Procedure Notes (Signed)
Procedure Name: Intubation Date/Time: 11/27/2019 10:59 AM Performed by: Bufford Spikes, CRNA Pre-anesthesia Checklist: Patient identified, Emergency Drugs available, Suction available and Patient being monitored Patient Re-evaluated:Patient Re-evaluated prior to induction Oxygen Delivery Method: Circle system utilized Preoxygenation: Pre-oxygenation with 100% oxygen Induction Type: IV induction Ventilation: Mask ventilation without difficulty Laryngoscope Size: Miller and 2 Grade View: Grade I Tube type: Oral Tube size: 7.0 mm Number of attempts: 1 Airway Equipment and Method: Stylet and Oral airway Placement Confirmation: ETT inserted through vocal cords under direct vision,  positive ETCO2 and breath sounds checked- equal and bilateral Secured at: 21 cm Tube secured with: Tape Dental Injury: Teeth and Oropharynx as per pre-operative assessment

## 2019-11-27 NOTE — Progress Notes (Signed)
AssistedDr. Hatchett with left, ultrasound guided, transabdominal plane block. Side rails up, monitors on throughout procedure. See vital signs in flow sheet. Tolerated Procedure well.  

## 2019-11-27 NOTE — Interval H&P Note (Signed)
History and Physical Interval Note:  11/27/2019 10:32 AM  [name] [name]  has presented today for surgery, with the diagnosis of [place].  The various methods of treatment have been discussed with the patient and family. After consideration of risks, benefits and other options for treatment, the patient has consented to  Procedure(s): LEFT INGUINAL HERNIA REPAIR WITH MESH (N/A) as a surgical intervention.  The patient's history has been reviewed, patient examined, no change in status, stable for surgery.  I have reviewed the patient's chart and labs.  Questions were answered to the patient's satisfaction.     [place]

## 2019-11-27 NOTE — Anesthesia Preprocedure Evaluation (Signed)
Anesthesia Evaluation  Patient identified by MRN, date of birth, ID band Patient awake    Reviewed: Allergy & Precautions, NPO status , Patient's Chart, lab work & pertinent test results  Airway Mallampati: I       Dental no notable dental hx. (+) Teeth Intact   Pulmonary former smoker,    Pulmonary exam normal breath sounds clear to auscultation       Cardiovascular hypertension, Pt. on medications Normal cardiovascular exam Rhythm:Regular Rate:Normal     Neuro/Psych negative neurological ROS     GI/Hepatic negative GI ROS,   Endo/Other  negative endocrine ROS  Renal/GU negative Renal ROS  negative genitourinary   Musculoskeletal   Abdominal Normal abdominal exam  (+)   Peds  Hematology negative hematology ROS (+)   Anesthesia Other Findings   Reproductive/Obstetrics                             Anesthesia Physical Anesthesia Plan  ASA: II  Anesthesia Plan: General   Post-op Pain Management:  Regional for Post-op pain   Induction: Intravenous  PONV Risk Score and Plan: 3 and Ondansetron, Dexamethasone and Midazolam  Airway Management Planned: Oral ETT  Additional Equipment: None  Intra-op Plan:   Post-operative Plan: Extubation in OR  Informed Consent: I have reviewed the patients History and Physical, chart, labs and discussed the procedure including the risks, benefits and alternatives for the proposed anesthesia with the patient or authorized representative who has indicated his/her understanding and acceptance.     Dental advisory given  Plan Discussed with: CRNA  Anesthesia Plan Comments:         Anesthesia Quick Evaluation

## 2019-11-27 NOTE — Op Note (Signed)
Left inguinal hernia repair with mesh, Open, Procedure Note  Indications: The patient presented with a history of a left, reducible inguinal hernia.  We discussed options of repair and the use of mesh.  Potential complications of an open versus laparoscopic with the use of mesh were both were described.  Long-term expectations and recurrence rates as well as chronic pain issues discussed.  He opted for open repair of his left inguinal hernia.  The risk of hernia repair include bleeding,  Infection,   Recurrence of the hernia,  Mesh use, chronic pain,  Organ injury,  Bowel injury,  Bladder injury,   nerve injury with numbness around the incision,  Death,  and worsening of preexisting  medical problems.  The alternatives to surgery have been discussed as well..  Long term expectations of both operative and non operative treatments have been discussed.   The patient agrees to proceed.    Pre-operative Diagnosis: left reducible inguinal hernia  Post-operative Diagnosis: same (indirect)   Surgeon: Turner Daniels MD   Assistants: none   Anesthesia: General endotracheal anesthesia and Local anesthesia 0.25.% bupivacaine  ASA Class: 2  Procedure Details  The patient was seen again in the Holding Room. The risks, benefits, complications, treatment options, and expected outcomes were discussed with the patient. The possibilities of reaction to medication, pulmonary aspiration, perforation of viscus, bleeding, recurrent infection, the need for additional procedures, and development of a complication requiring transfusion or further operation were discussed with the patient and/or family. There was concurrence with the proposed plan, and informed consent was obtained. The site of surgery was properly noted/marked. The patient was taken to the Operating Room, identified as [name] [name], and the procedure verified as hernia repair. A Time Out was held and the above information confirmed.  The patient was  placed in the supine position and underwent induction of anesthesia, the lower abdomen and groin was prepped and draped in the standard fashion, and 0.25% Marcaine with epinephrine was used to anesthetize the skin over the mid-portion of the inguinal canal. A transverse incision was made. Dissection was carried through the soft tissue to expose the inguinal canal and inguinal ligament along its lower edge. The external oblique fascia was split along the course of its fibers, exposing the inguinal canal. The cord and nerve were looped using a Penrose drain and reflected out of the field. The defect was exposed and a piece of prolene hernia system ultrapro mesh was and placed into the indirect defect. Interupted 1-0 novafil suture was then used  to repair the defect, with the suture being sewn from the pubic tubercle inferiorly and superiorly along the canal to a level just beyond the internal ring. The mesh was split to allow passage of the cord and nerve into the canal without entrapment. The contents were then returned to canal and the external oblique fashion was then closed in a continuous fashion using 3-0 Vicryl suture taking care not to cause entrapment. Scarpa's layer closed with 3 0 vicryl and 4 0 monocryl used to close the skin.  Dermabond used for dressing.  Instrument, sponge, and needle counts were correct prior to closure and at the conclusion of the case.  Findings: Hernia as above  Estimated Blood Loss: Minimal         Drains: None         Total IV Fluids: Per anesthesia         Specimens: None  Complications: None; patient tolerated the procedure well.         Disposition: PACU - hemodynamically stable.         Condition: stable

## 2019-11-27 NOTE — Addendum Note (Signed)
Addendum  created 11/27/19 1332 by Lyn Hollingshead, MD   Child order released for a procedure order, Clinical Note Signed, Intraprocedure Blocks edited

## 2019-11-27 NOTE — Anesthesia Procedure Notes (Signed)
Anesthesia Regional Block: TAP block   Pre-Anesthetic Checklist: ,, timeout performed, Correct Patient, Correct Site, Correct Laterality, Correct Procedure, Correct Position, site marked, Risks and benefits discussed,  Surgical consent,  Pre-op evaluation,  At surgeon's request and post-op pain management  Laterality: Left and N/A  Prep: chloraprep       Needles:  Injection technique: Single-shot  Needle Type: Echogenic Stimulator Needle     Needle Length: 10cm  Needle Gauge: 21   Needle insertion depth: 1.5 cm   Additional Needles:   Procedures:,,,, ultrasound used (permanent image in chart),,,,  Narrative:  Start time: 11/27/2019 10:40 AM End time: 11/27/2019 10:50 AM Injection made incrementally with aspirations every 5 mL.  Performed by: Personally  Anesthesiologist: Lyn Hollingshead, MD

## 2019-11-27 NOTE — Anesthesia Postprocedure Evaluation (Signed)
Anesthesia Post Note  Patient: [name] [name]  Procedure(s) Performed: LEFT INGUINAL HERNIA REPAIR WITH MESH (Left Groin)     Patient location during evaluation: Phase II Anesthesia Type: General Level of consciousness: awake Pain management: pain level controlled Vital Signs Assessment: post-procedure vital signs reviewed and stable Respiratory status: spontaneous breathing Cardiovascular status: stable Postop Assessment: no apparent nausea or vomiting Anesthetic complications: no    Last Vitals:  Vitals:   11/27/19 1245 11/27/19 1300  BP: 116/65 116/70  Pulse: 68 65  Resp: 12 16  Temp:    SpO2: 100% 99%    Last Pain:  Vitals:   11/27/19 1300  TempSrc:   PainSc: 2    Pain Goal:                   Huston Foley

## 2019-11-27 NOTE — Discharge Instructions (Signed)
[facility] _______[facility] ° °UMBILICAL OR INGUINAL HERNIA REPAIR: POST OP INSTRUCTIONS ° °Always review your discharge instruction sheet given to you by the facility where your surgery was performed. °IF YOU HAVE DISABILITY OR FAMILY LEAVE FORMS, YOU MUST BRING THEM TO THE OFFICE FOR PROCESSING.   °DO NOT GIVE THEM TO YOUR DOCTOR. ° °1. A  prescription for pain medication may be given to you upon discharge.  Take your pain medication as prescribed, if needed.  If narcotic pain medicine is not needed, then you may take acetaminophen (Tylenol) or ibuprofen (Advil) as needed. °2. Take your usually prescribed medications unless otherwise directed. °If you need a refill on your pain medication, please contact your pharmacy.  They will contact our office to request authorization. Prescriptions will not be filled after 5 pm or on week-ends. °3. You should follow a light diet the first 24 hours after arrival home, such as soup and crackers, etc.  Be sure to include lots of fluids daily.  Resume your normal diet the day after surgery. °4.Most patients will experience some swelling and bruising around the umbilicus or in the groin and scrotum.  Ice packs and reclining will help.  Swelling and bruising can take several days to resolve.  °6. It is common to experience some constipation if taking pain medication after surgery.  Increasing fluid intake and taking a stool softener (such as Colace) will usually help or prevent this problem from occurring.  A mild laxative (Milk of Magnesia or Miralax) should be taken according to package directions if there are no bowel movements after 48 hours. °7. Unless discharge instructions indicate otherwise, you may remove your bandages 24-48 hours after surgery, and you may shower at that time.  You may have steri-strips (small skin tapes) in place directly over the incision.  These strips should be left on the skin for 7-10 days.  If your surgeon used skin glue on the  incision, you may shower in 24 hours.  The glue will flake off over the next 2-3 weeks.  Any sutures or staples will be removed at the office during your follow-up visit. °8. ACTIVITIES:  You may resume regular (light) daily activities beginning the next day--such as daily self-care, walking, climbing stairs--gradually increasing activities as tolerated.  You may have sexual intercourse when it is comfortable.  Refrain from any heavy lifting or straining until approved by your doctor. ° °a.You may drive when you are no longer taking prescription pain medication, you can comfortably wear a seatbelt, and you can safely maneuver your car and apply brakes. °b.RETURN TO WORK:   °_____________________________________________ ° °9.You should see your doctor in the office for a follow-up appointment approximately 2-3 weeks after your surgery.  Make sure that you call for this appointment within a day or two after you arrive home to insure a convenient appointment time. °10.OTHER INSTRUCTIONS: _________________________ °   _____________________________________ ° °WHEN TO CALL YOUR DOCTOR: °1. Fever over 101.0 °2. Inability to urinate °3. Nausea and/or vomiting °4. Extreme swelling or bruising °5. Continued bleeding from incision. °6. Increased pain, redness, or drainage from the incision ° °The clinic staff is available to answer your questions during regular business hours.  Please don’t hesitate to call and ask to speak to one of the nurses for clinical concerns.  If you have a medical emergency, go to the nearest emergency room or call 911.  A surgeon from [facility] is always on call at the hospital ° ° °  [address], [address], [place], [place]  [address] ? ° [address] [address], [place], [place]   [address] °[phone] ? [phone] ? FAX [phone] °Web site: [email or web address] ° ° °Post Anesthesia Home Care Instructions ° °Activity: °Get plenty of rest for the remainder of the day. A  responsible individual must stay with you for 24 hours following the procedure.  °For the next 24 hours, DO NOT: °-Drive a car °-Operate machinery °-Drink alcoholic beverages °-Take any medication unless instructed by your physician °-Make any legal decisions or sign important papers. ° °Meals: °Start with liquid foods such as gelatin or soup. Progress to regular foods as tolerated. Avoid greasy, spicy, heavy foods. If nausea and/or vomiting occur, drink only clear liquids until the nausea and/or vomiting subsides. Call your physician if vomiting continues. ° °Special Instructions/Symptoms: °Your throat may feel dry or sore from the anesthesia or the breathing tube placed in your throat during surgery. If this causes discomfort, gargle with warm salt water. The discomfort should disappear within 24 hours. ° °If you had a scopolamine patch placed behind your ear for the management of post- operative nausea and/or vomiting: ° °1. The medication in the patch is effective for 72 hours, after which it should be removed.  Wrap patch in a tissue and discard in the trash. Wash hands thoroughly with soap and water. °2. You may remove the patch earlier than 72 hours if you experience unpleasant side effects which may include dry mouth, dizziness or visual disturbances. °3. Avoid touching the patch. Wash your hands with soap and water after contact with the patch. °  ° °

## 2019-11-28 ENCOUNTER — Encounter: Payer: Self-pay | Admitting: *Deleted

## 2019-11-28 NOTE — Addendum Note (Signed)
Addendum  created 11/28/19 1205 by Tawni Millers, CRNA   Charge Capture section accepted

## 2020-08-09 IMAGING — MR MR LUMBAR SPINE W/O CM
4 of 5 series · 26 of 48 positions shown · non-contrast
Comparison: None.

CLINICAL DATA: Low back pain radiating to both buttocks and legs,
worsening recently. Improved following spinal injection.

EXAM:
MRI LUMBAR SPINE WITHOUT CONTRAST
TECHNIQUE: Multiplanar, multisequence MR imaging of the lumbar spine was
performed. No intravenous contrast was administered.

[Series 2: T2 · sagittal · 4.0mm · 1.09mm/px · 6 of 16 slices shown (1 of 2)]
[im 1/16]
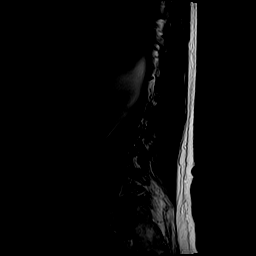
[im 4/16]
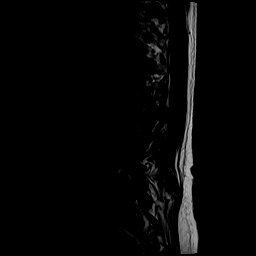
[im 7/16]
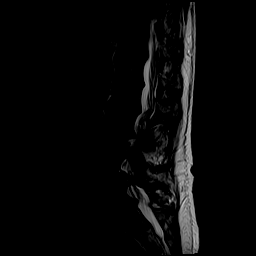
[im 10/16]
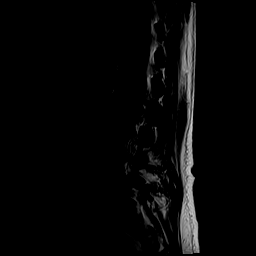
[im 13/16]
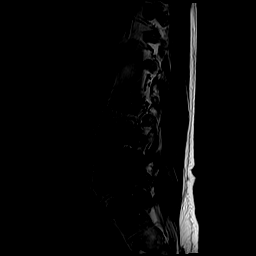
[im 16/16]
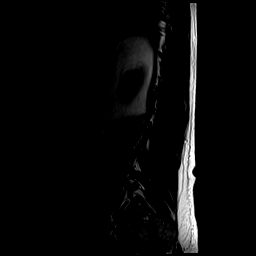

[Series 4: T1 · sagittal · 4.0mm · 1.09mm/px · 6 of 16 slices shown (1 of 2)]
[im 1/16]
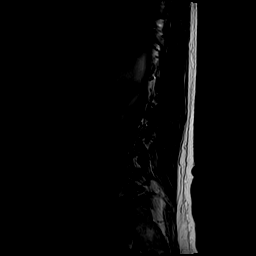
[im 4/16]
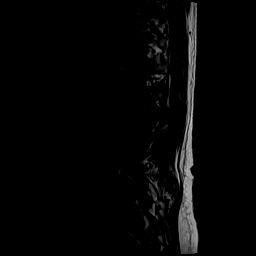
[im 7/16]
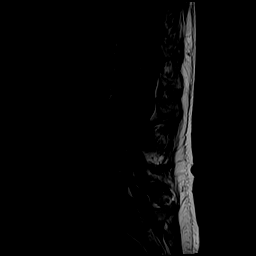
[im 10/16]
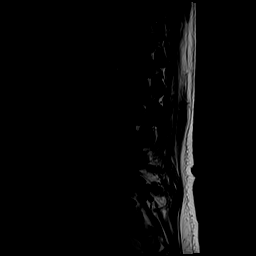
[im 13/16]
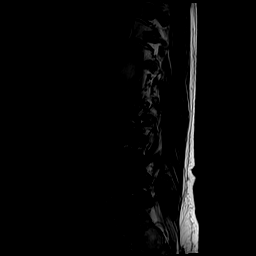
[im 16/16]
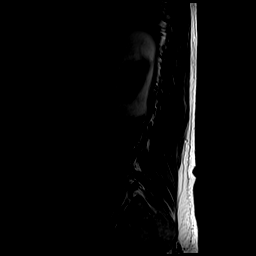

[Series 5: T2 · axial · 4.0mm · 0.39mm/px · z∈[-91,+120]mm · 9 of 40 slices shown (2 of 2)]
[im 1/40]
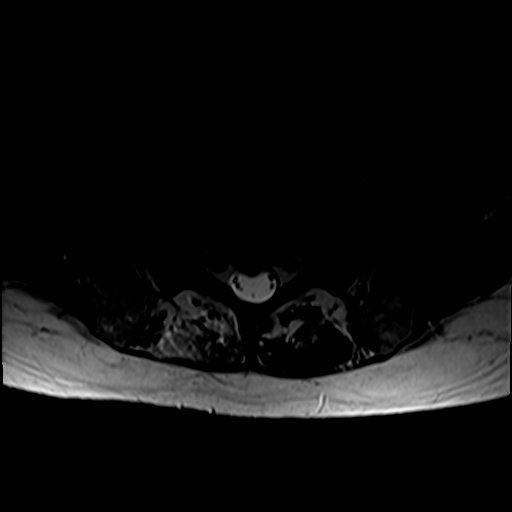
[im 6/40]
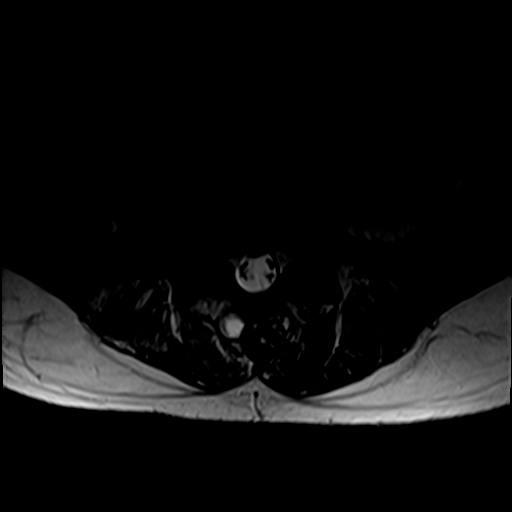
[im 12/40]
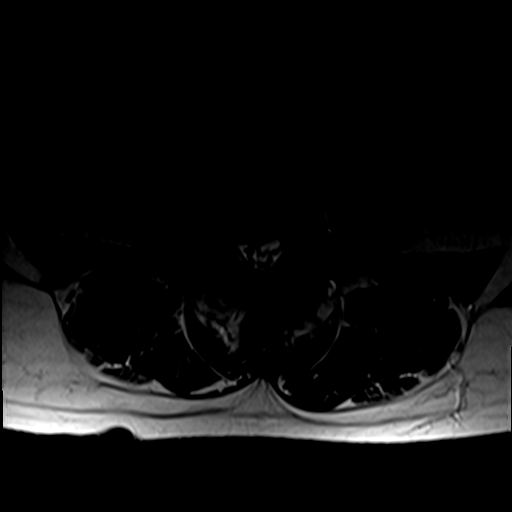
[im 17/40]
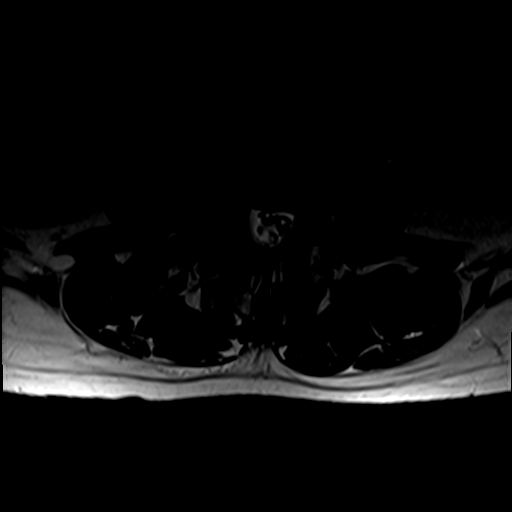
[im 20/40]
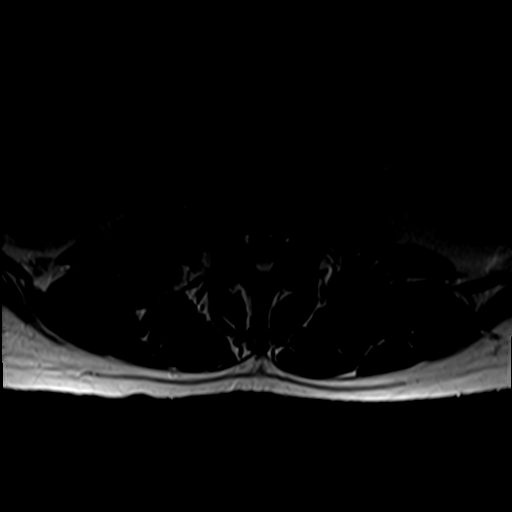
[im 23/40]
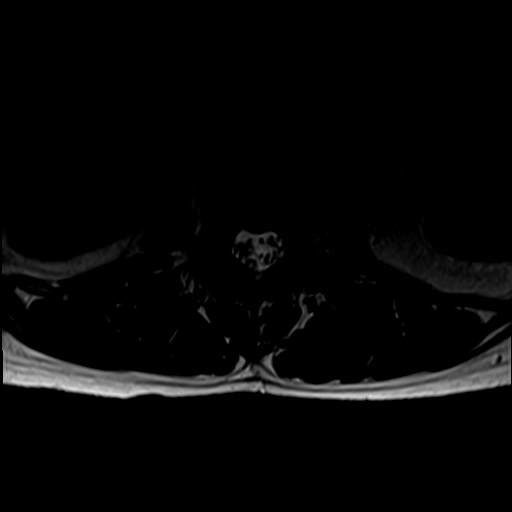
[im 28/40]
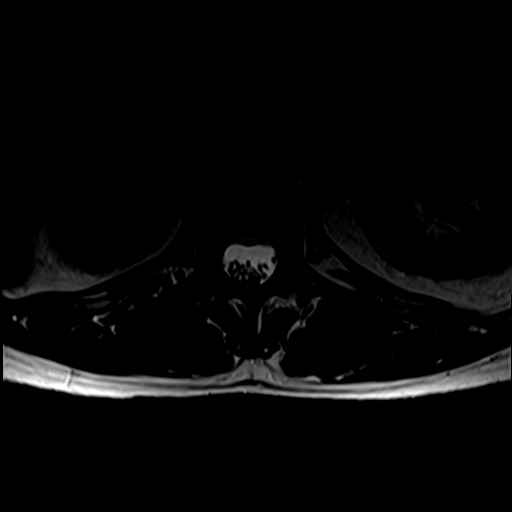
[im 34/40]
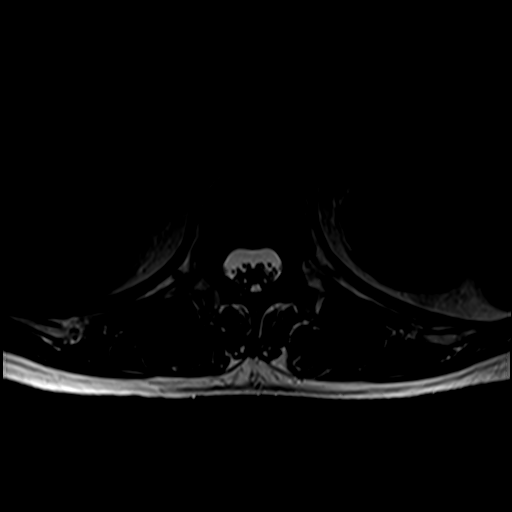
[im 40/40]
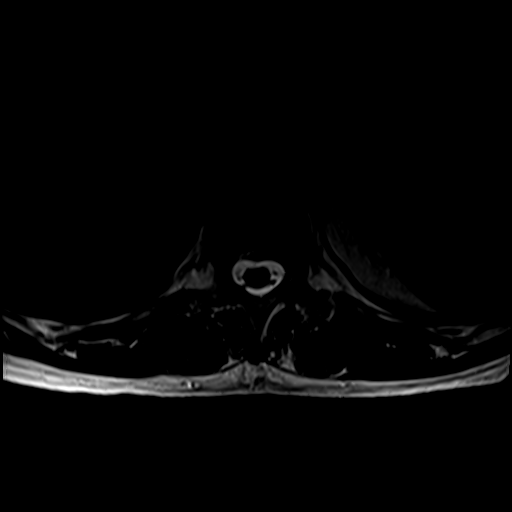

[Series 6: T1 · axial · 4.0mm · 0.39mm/px · z∈[-91,+91]mm · 5 of 40 slices shown (2 of 2)]
[im 1/40]
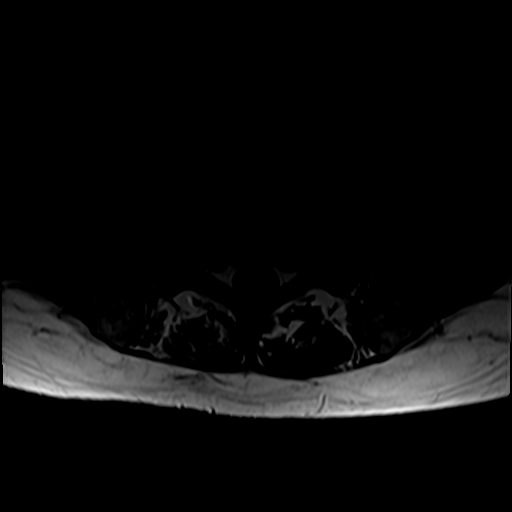
[im 6/40]
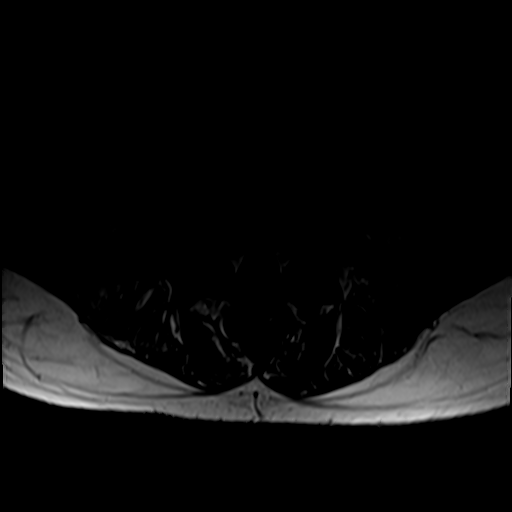
[im 12/40]
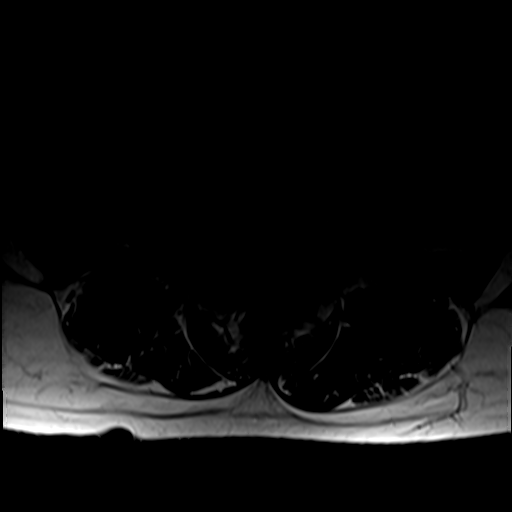
[im 20/40]
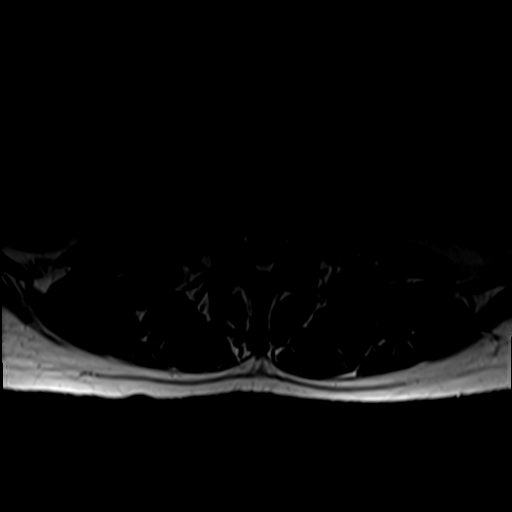
[im 34/40]
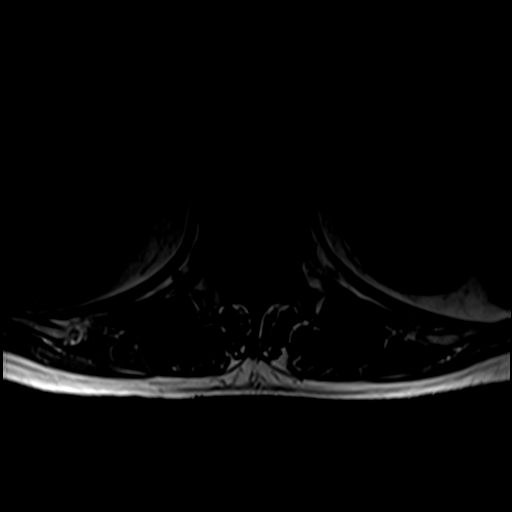

[26 of 48 positions shown; findings below may reference images not displayed]

FINDINGS: Segmentation:  5 lumbar type vertebral bodies.

Alignment: 2 mm retrolisthesis L1-2. 4 mm retrolisthesis L2-3 and
L3-4. 1 cm anterolisthesis L4-5.

Vertebrae: No fracture or primary bone lesion. Discogenic endplate
marrow changes.

Conus medullaris and cauda equina: Conus extends to the L1 level.
Conus and cauda equina appear normal.

Paraspinal and other soft tissues: Negative

Disc levels:

T11-12: Central disc herniation indents the thecal sac but does not
appear to affect the distal cord or show foraminal extension.
Similar appearance to the previous exam.

T12-L1: Shallow disc protrusion more towards the left. Slight
indentation of the thecal sac. No stenosis.

L1-2: 2 mm retrolisthesis. Bulging of the disc. Mild narrowing of
the lateral recesses and foramina but without likely neural
compression.

L2-3: 4 mm retrolisthesis. Bulging of the disc. Mild stenosis of the
lateral recesses. Moderate bilateral foraminal stenosis could affect
the exiting L2 nerves.

L3-4: 4 mm retrolisthesis. Shallow protrusion of the disc. Facet and
ligamentous hypertrophy. Multifactorial stenosis. Foraminal stenosis
particularly severe on the right. Neural compression could occur at
this level on either side, and particularly affecting the exiting
right L3 nerve. This has worsened slightly since the previous exam.

L4-5: Chronic facet arthropathy with 1 cm of anterolisthesis.
Bulging of the disc. Multifactorial stenosis of the canal and
foramina left more than right. Neural compression could occur on
either side at this level, and particularly affecting the exiting
left L4 nerve. Findings at this level have worsened since 6158 when
there was only about 6 mm of anterolisthesis.

L5-S1: Mild bulging of the disc. Mild facet degeneration and
hypertrophy. No central canal stenosis. Foraminal stenosis right
worse than left could affect either L5 nerve. Similar appearance to
the previous exam.
IMPRESSION: Worsening of the findings at L4-5. Advanced facet arthropathy now
with 1 cm of anterolisthesis. Bulging of the disc. Stenosis of the
canal could cause neural compression on either or both sides.
Foraminal stenosis, left worse than right with particular potential
to compress the exiting L4 nerve on the left.

L3-4: Moderate multifactorial stenosis. Foraminal stenosis right
worse than left. Neural compression could occur at this level on
either side and in particular affecting the exiting right L3 nerve.

L2-3: Bilateral foraminal stenosis that could cause neural
compression.

Other degenerative changes appear similar to the study of 6158 as
outlined above.

## 2020-12-05 DIAGNOSIS — K219 Gastro-esophageal reflux disease without esophagitis: Secondary | ICD-10-CM | POA: Diagnosis not present

## 2020-12-05 DIAGNOSIS — F419 Anxiety disorder, unspecified: Secondary | ICD-10-CM | POA: Diagnosis not present

## 2020-12-05 DIAGNOSIS — I10 Essential (primary) hypertension: Secondary | ICD-10-CM | POA: Diagnosis not present

## 2020-12-05 DIAGNOSIS — M1990 Unspecified osteoarthritis, unspecified site: Secondary | ICD-10-CM | POA: Diagnosis not present

## 2021-02-18 DIAGNOSIS — M1712 Unilateral primary osteoarthritis, left knee: Secondary | ICD-10-CM | POA: Diagnosis not present

## 2021-02-23 DIAGNOSIS — Z23 Encounter for immunization: Secondary | ICD-10-CM | POA: Diagnosis not present

## 2021-02-25 DIAGNOSIS — M1712 Unilateral primary osteoarthritis, left knee: Secondary | ICD-10-CM | POA: Diagnosis not present

## 2021-03-04 DIAGNOSIS — M1712 Unilateral primary osteoarthritis, left knee: Secondary | ICD-10-CM | POA: Diagnosis not present

## 2021-06-02 DIAGNOSIS — I10 Essential (primary) hypertension: Secondary | ICD-10-CM | POA: Diagnosis not present

## 2021-06-02 DIAGNOSIS — E782 Mixed hyperlipidemia: Secondary | ICD-10-CM | POA: Diagnosis not present

## 2021-06-02 DIAGNOSIS — Z0000 Encounter for general adult medical examination without abnormal findings: Secondary | ICD-10-CM | POA: Diagnosis not present

## 2021-06-02 DIAGNOSIS — Z1211 Encounter for screening for malignant neoplasm of colon: Secondary | ICD-10-CM | POA: Diagnosis not present

## 2021-06-02 DIAGNOSIS — Z125 Encounter for screening for malignant neoplasm of prostate: Secondary | ICD-10-CM | POA: Diagnosis not present

## 2021-06-09 DIAGNOSIS — Z0000 Encounter for general adult medical examination without abnormal findings: Secondary | ICD-10-CM | POA: Diagnosis not present

## 2021-06-09 DIAGNOSIS — F419 Anxiety disorder, unspecified: Secondary | ICD-10-CM | POA: Diagnosis not present

## 2021-06-09 DIAGNOSIS — K219 Gastro-esophageal reflux disease without esophagitis: Secondary | ICD-10-CM | POA: Diagnosis not present

## 2021-06-09 DIAGNOSIS — I10 Essential (primary) hypertension: Secondary | ICD-10-CM | POA: Diagnosis not present

## 2021-06-09 DIAGNOSIS — M1990 Unspecified osteoarthritis, unspecified site: Secondary | ICD-10-CM | POA: Diagnosis not present

## 2021-06-09 DIAGNOSIS — E7800 Pure hypercholesterolemia, unspecified: Secondary | ICD-10-CM | POA: Diagnosis not present

## 2021-07-08 DIAGNOSIS — M1712 Unilateral primary osteoarthritis, left knee: Secondary | ICD-10-CM | POA: Diagnosis not present

## 2021-07-14 DIAGNOSIS — Z23 Encounter for immunization: Secondary | ICD-10-CM | POA: Diagnosis not present

## 2021-07-29 DIAGNOSIS — Z23 Encounter for immunization: Secondary | ICD-10-CM | POA: Diagnosis not present

## 2021-09-08 DIAGNOSIS — M170 Bilateral primary osteoarthritis of knee: Secondary | ICD-10-CM | POA: Diagnosis not present

## 2021-09-22 DIAGNOSIS — M1712 Unilateral primary osteoarthritis, left knee: Secondary | ICD-10-CM | POA: Diagnosis not present

## 2021-09-29 DIAGNOSIS — M1712 Unilateral primary osteoarthritis, left knee: Secondary | ICD-10-CM | POA: Diagnosis not present

## 2021-10-06 DIAGNOSIS — M1712 Unilateral primary osteoarthritis, left knee: Secondary | ICD-10-CM | POA: Diagnosis not present

## 2021-10-13 DIAGNOSIS — M1712 Unilateral primary osteoarthritis, left knee: Secondary | ICD-10-CM | POA: Diagnosis not present

## 2021-10-13 DIAGNOSIS — M5030 Other cervical disc degeneration, unspecified cervical region: Secondary | ICD-10-CM | POA: Diagnosis not present

## 2021-10-27 DIAGNOSIS — M542 Cervicalgia: Secondary | ICD-10-CM | POA: Diagnosis not present

## 2021-11-02 DIAGNOSIS — M542 Cervicalgia: Secondary | ICD-10-CM | POA: Diagnosis not present

## 2021-12-07 DIAGNOSIS — M542 Cervicalgia: Secondary | ICD-10-CM | POA: Diagnosis not present

## 2021-12-14 DIAGNOSIS — Z1211 Encounter for screening for malignant neoplasm of colon: Secondary | ICD-10-CM | POA: Diagnosis not present

## 2021-12-14 DIAGNOSIS — I10 Essential (primary) hypertension: Secondary | ICD-10-CM | POA: Diagnosis not present

## 2021-12-14 DIAGNOSIS — F419 Anxiety disorder, unspecified: Secondary | ICD-10-CM | POA: Diagnosis not present

## 2021-12-15 DIAGNOSIS — Z1211 Encounter for screening for malignant neoplasm of colon: Secondary | ICD-10-CM | POA: Diagnosis not present

## 2022-01-25 DIAGNOSIS — M4726 Other spondylosis with radiculopathy, lumbar region: Secondary | ICD-10-CM | POA: Diagnosis not present

## 2022-01-28 DIAGNOSIS — M4726 Other spondylosis with radiculopathy, lumbar region: Secondary | ICD-10-CM | POA: Diagnosis not present

## 2022-02-03 DIAGNOSIS — M4726 Other spondylosis with radiculopathy, lumbar region: Secondary | ICD-10-CM | POA: Diagnosis not present

## 2022-02-10 DIAGNOSIS — M1712 Unilateral primary osteoarthritis, left knee: Secondary | ICD-10-CM | POA: Diagnosis not present

## 2022-02-10 DIAGNOSIS — M7542 Impingement syndrome of left shoulder: Secondary | ICD-10-CM | POA: Diagnosis not present

## 2022-04-21 DIAGNOSIS — M1712 Unilateral primary osteoarthritis, left knee: Secondary | ICD-10-CM | POA: Diagnosis not present

## 2022-04-28 DIAGNOSIS — M25512 Pain in left shoulder: Secondary | ICD-10-CM | POA: Diagnosis not present

## 2022-04-28 DIAGNOSIS — M1712 Unilateral primary osteoarthritis, left knee: Secondary | ICD-10-CM | POA: Diagnosis not present

## 2022-04-28 DIAGNOSIS — M25511 Pain in right shoulder: Secondary | ICD-10-CM | POA: Diagnosis not present

## 2022-05-05 DIAGNOSIS — M1712 Unilateral primary osteoarthritis, left knee: Secondary | ICD-10-CM | POA: Diagnosis not present

## 2022-05-12 DIAGNOSIS — M1712 Unilateral primary osteoarthritis, left knee: Secondary | ICD-10-CM | POA: Diagnosis not present

## 2022-05-12 DIAGNOSIS — M25511 Pain in right shoulder: Secondary | ICD-10-CM | POA: Diagnosis not present

## 2022-05-14 DIAGNOSIS — M25511 Pain in right shoulder: Secondary | ICD-10-CM | POA: Diagnosis not present

## 2022-05-19 DIAGNOSIS — M1712 Unilateral primary osteoarthritis, left knee: Secondary | ICD-10-CM | POA: Diagnosis not present

## 2022-05-19 DIAGNOSIS — M25511 Pain in right shoulder: Secondary | ICD-10-CM | POA: Diagnosis not present

## 2022-06-22 DIAGNOSIS — M1712 Unilateral primary osteoarthritis, left knee: Secondary | ICD-10-CM | POA: Diagnosis not present

## 2022-06-22 DIAGNOSIS — D1721 Benign lipomatous neoplasm of skin and subcutaneous tissue of right arm: Secondary | ICD-10-CM | POA: Diagnosis not present

## 2022-07-02 DIAGNOSIS — E782 Mixed hyperlipidemia: Secondary | ICD-10-CM | POA: Diagnosis not present

## 2022-07-02 DIAGNOSIS — Z0000 Encounter for general adult medical examination without abnormal findings: Secondary | ICD-10-CM | POA: Diagnosis not present

## 2022-07-02 DIAGNOSIS — M1990 Unspecified osteoarthritis, unspecified site: Secondary | ICD-10-CM | POA: Diagnosis not present

## 2022-07-02 DIAGNOSIS — E871 Hypo-osmolality and hyponatremia: Secondary | ICD-10-CM | POA: Diagnosis not present

## 2022-07-02 DIAGNOSIS — I10 Essential (primary) hypertension: Secondary | ICD-10-CM | POA: Diagnosis not present

## 2022-07-02 DIAGNOSIS — Z125 Encounter for screening for malignant neoplasm of prostate: Secondary | ICD-10-CM | POA: Diagnosis not present

## 2022-07-02 DIAGNOSIS — F419 Anxiety disorder, unspecified: Secondary | ICD-10-CM | POA: Diagnosis not present

## 2022-08-11 DIAGNOSIS — M1712 Unilateral primary osteoarthritis, left knee: Secondary | ICD-10-CM | POA: Diagnosis not present

## 2022-08-23 DIAGNOSIS — Z23 Encounter for immunization: Secondary | ICD-10-CM | POA: Diagnosis not present

## 2022-09-30 DIAGNOSIS — M1712 Unilateral primary osteoarthritis, left knee: Secondary | ICD-10-CM | POA: Diagnosis not present

## 2022-11-23 DIAGNOSIS — M1712 Unilateral primary osteoarthritis, left knee: Secondary | ICD-10-CM | POA: Diagnosis not present

## 2023-01-06 DIAGNOSIS — M1990 Unspecified osteoarthritis, unspecified site: Secondary | ICD-10-CM | POA: Diagnosis not present

## 2023-01-06 DIAGNOSIS — I10 Essential (primary) hypertension: Secondary | ICD-10-CM | POA: Diagnosis not present

## 2023-01-06 DIAGNOSIS — F419 Anxiety disorder, unspecified: Secondary | ICD-10-CM | POA: Diagnosis not present

## 2023-01-19 DIAGNOSIS — M5136 Other intervertebral disc degeneration, lumbar region: Secondary | ICD-10-CM | POA: Diagnosis not present

## 2023-01-19 DIAGNOSIS — M1712 Unilateral primary osteoarthritis, left knee: Secondary | ICD-10-CM | POA: Diagnosis not present

## 2023-01-25 DIAGNOSIS — M1712 Unilateral primary osteoarthritis, left knee: Secondary | ICD-10-CM | POA: Diagnosis not present

## 2023-02-01 DIAGNOSIS — M1712 Unilateral primary osteoarthritis, left knee: Secondary | ICD-10-CM | POA: Diagnosis not present

## 2023-02-03 DIAGNOSIS — I10 Essential (primary) hypertension: Secondary | ICD-10-CM | POA: Diagnosis not present

## 2023-02-08 DIAGNOSIS — M1712 Unilateral primary osteoarthritis, left knee: Secondary | ICD-10-CM | POA: Diagnosis not present

## 2023-02-15 DIAGNOSIS — M1712 Unilateral primary osteoarthritis, left knee: Secondary | ICD-10-CM | POA: Diagnosis not present

## 2023-02-22 DIAGNOSIS — M1712 Unilateral primary osteoarthritis, left knee: Secondary | ICD-10-CM | POA: Diagnosis not present

## 2023-02-22 DIAGNOSIS — M5136 Other intervertebral disc degeneration, lumbar region: Secondary | ICD-10-CM | POA: Diagnosis not present

## 2023-07-13 DIAGNOSIS — Z1211 Encounter for screening for malignant neoplasm of colon: Secondary | ICD-10-CM | POA: Diagnosis not present

## 2023-07-13 DIAGNOSIS — M1990 Unspecified osteoarthritis, unspecified site: Secondary | ICD-10-CM | POA: Diagnosis not present

## 2023-07-13 DIAGNOSIS — E871 Hypo-osmolality and hyponatremia: Secondary | ICD-10-CM | POA: Diagnosis not present

## 2023-07-13 DIAGNOSIS — Z125 Encounter for screening for malignant neoplasm of prostate: Secondary | ICD-10-CM | POA: Diagnosis not present

## 2023-07-13 DIAGNOSIS — E782 Mixed hyperlipidemia: Secondary | ICD-10-CM | POA: Diagnosis not present

## 2023-07-13 DIAGNOSIS — K219 Gastro-esophageal reflux disease without esophagitis: Secondary | ICD-10-CM | POA: Diagnosis not present

## 2023-07-13 DIAGNOSIS — F419 Anxiety disorder, unspecified: Secondary | ICD-10-CM | POA: Diagnosis not present

## 2023-07-13 DIAGNOSIS — M545 Low back pain, unspecified: Secondary | ICD-10-CM | POA: Diagnosis not present

## 2023-07-13 DIAGNOSIS — I10 Essential (primary) hypertension: Secondary | ICD-10-CM | POA: Diagnosis not present

## 2023-07-13 DIAGNOSIS — R195 Other fecal abnormalities: Secondary | ICD-10-CM | POA: Diagnosis not present

## 2023-07-13 DIAGNOSIS — Z0000 Encounter for general adult medical examination without abnormal findings: Secondary | ICD-10-CM | POA: Diagnosis not present

## 2023-07-14 DIAGNOSIS — Z0000 Encounter for general adult medical examination without abnormal findings: Secondary | ICD-10-CM | POA: Diagnosis not present

## 2023-07-28 DIAGNOSIS — Z23 Encounter for immunization: Secondary | ICD-10-CM | POA: Diagnosis not present

## 2023-08-15 ENCOUNTER — Telehealth: Payer: Self-pay | Admitting: Nurse Practitioner

## 2023-08-15 ENCOUNTER — Encounter: Payer: Self-pay | Admitting: Nurse Practitioner

## 2023-09-07 DIAGNOSIS — M1712 Unilateral primary osteoarthritis, left knee: Secondary | ICD-10-CM | POA: Diagnosis not present

## 2023-09-14 DIAGNOSIS — M1712 Unilateral primary osteoarthritis, left knee: Secondary | ICD-10-CM | POA: Diagnosis not present

## 2023-09-21 DIAGNOSIS — M1712 Unilateral primary osteoarthritis, left knee: Secondary | ICD-10-CM | POA: Diagnosis not present

## 2023-09-28 DIAGNOSIS — M1712 Unilateral primary osteoarthritis, left knee: Secondary | ICD-10-CM | POA: Diagnosis not present

## 2023-10-05 DIAGNOSIS — M1712 Unilateral primary osteoarthritis, left knee: Secondary | ICD-10-CM | POA: Diagnosis not present

## 2023-11-10 ENCOUNTER — Ambulatory Visit: Payer: Medicare Other | Admitting: Nurse Practitioner

## 2024-01-19 DIAGNOSIS — I10 Essential (primary) hypertension: Secondary | ICD-10-CM | POA: Diagnosis not present

## 2024-01-19 DIAGNOSIS — M545 Low back pain, unspecified: Secondary | ICD-10-CM | POA: Diagnosis not present

## 2024-01-19 DIAGNOSIS — F419 Anxiety disorder, unspecified: Secondary | ICD-10-CM | POA: Diagnosis not present

## 2024-01-19 DIAGNOSIS — E782 Mixed hyperlipidemia: Secondary | ICD-10-CM | POA: Diagnosis not present

## 2024-02-07 DIAGNOSIS — M25462 Effusion, left knee: Secondary | ICD-10-CM | POA: Diagnosis not present

## 2024-02-07 DIAGNOSIS — M1712 Unilateral primary osteoarthritis, left knee: Secondary | ICD-10-CM | POA: Diagnosis not present

## 2024-04-10 DIAGNOSIS — M1712 Unilateral primary osteoarthritis, left knee: Secondary | ICD-10-CM | POA: Diagnosis not present

## 2024-04-17 DIAGNOSIS — M1712 Unilateral primary osteoarthritis, left knee: Secondary | ICD-10-CM | POA: Diagnosis not present

## 2024-04-24 DIAGNOSIS — M1712 Unilateral primary osteoarthritis, left knee: Secondary | ICD-10-CM | POA: Diagnosis not present

## 2024-05-02 DIAGNOSIS — M1712 Unilateral primary osteoarthritis, left knee: Secondary | ICD-10-CM | POA: Diagnosis not present

## 2024-05-08 DIAGNOSIS — M1712 Unilateral primary osteoarthritis, left knee: Secondary | ICD-10-CM | POA: Diagnosis not present

## 2024-07-02 ENCOUNTER — Ambulatory Visit (INDEPENDENT_AMBULATORY_CARE_PROVIDER_SITE_OTHER): Admitting: Podiatry

## 2024-07-02 DIAGNOSIS — L600 Ingrowing nail: Secondary | ICD-10-CM

## 2024-07-02 NOTE — Progress Notes (Signed)
    Chief Complaint  Patient presents with   Ingrown Toenail    Left 1st. Intermittent soreness. No drainage, no swelling, no redness. Patient's pedicurist attempted to cut out some of the nail, and advised he come see us . Not diabetic, and not on anticoag.    HPI: 73 y.o. male presents today with concern of an ingrown toenail to the left hallux along the lateral nail border.  He typically gets pedicures on a regular basis, but states his normal technician was not there last time.  A different technician did not do the same job as the one prior and he had a lot of pain afterwards.  He called the office to make the appointment for the painful ingrown toenail, but prior to today the toe started feeling much better and he is not having any pain at this time.  Denies any drainage or redness.  Past Medical History:  Diagnosis Date   DDD (degenerative disc disease), cervical    DDD (degenerative disc disease), lumbar    DDD (degenerative disc disease), lumbar    Hypertension    Osteoarthritis    Past Surgical History:  Procedure Laterality Date   HEMORRHOID SURGERY     INGUINAL HERNIA REPAIR Left 11/27/2019   Procedure: LEFT INGUINAL HERNIA REPAIR WITH MESH;  Surgeon: Vanderbilt Ned, MD;  Location: [facility];  Service: General;  Laterality: Left;   KNEE SURGERY     No Known Allergies   Physical Exam: Palpable pedal pulses noted.  The left hallux nail lateral border, as well as the medial border to a milder degree, is incurvated with no signs of paronychia.  There is no erythema or edema.  Minimal to no pain on palpation is noted along the distal lateral border.  Epicritic sensation is intact.  Assessment/Plan of Care: 1. Ingrown toenail     The left hallux lateral nail border was cut back along the distal edge to smooth out jagged portion caused by the nail technician at the salon.  We did discuss a PNA procedure of the border(s) if this recurs or starts to get infected.  He  can also call the office to request antibiotic he is developing an infection prior to when he came into our office.  Antibiotic ointment and a Band-Aid applied today.  He can remove this once he gets home.  Follow-up as needed   Awanda CHARM Imperial, DPM, FACFAS [facility]     [address] [place], [place] [address]                Office [phone]  Fax [phone]

## 2024-07-20 DIAGNOSIS — Z0000 Encounter for general adult medical examination without abnormal findings: Secondary | ICD-10-CM | POA: Diagnosis not present

## 2024-07-20 DIAGNOSIS — Z125 Encounter for screening for malignant neoplasm of prostate: Secondary | ICD-10-CM | POA: Diagnosis not present

## 2024-07-20 DIAGNOSIS — Z6827 Body mass index (BMI) 27.0-27.9, adult: Secondary | ICD-10-CM | POA: Diagnosis not present

## 2024-07-20 DIAGNOSIS — K219 Gastro-esophageal reflux disease without esophagitis: Secondary | ICD-10-CM | POA: Diagnosis not present

## 2024-07-20 DIAGNOSIS — M1990 Unspecified osteoarthritis, unspecified site: Secondary | ICD-10-CM | POA: Diagnosis not present

## 2024-07-20 DIAGNOSIS — E871 Hypo-osmolality and hyponatremia: Secondary | ICD-10-CM | POA: Diagnosis not present

## 2024-07-20 DIAGNOSIS — I10 Essential (primary) hypertension: Secondary | ICD-10-CM | POA: Diagnosis not present

## 2024-07-20 DIAGNOSIS — Z23 Encounter for immunization: Secondary | ICD-10-CM | POA: Diagnosis not present

## 2024-07-20 DIAGNOSIS — E782 Mixed hyperlipidemia: Secondary | ICD-10-CM | POA: Diagnosis not present

## 2024-08-02 DIAGNOSIS — M25462 Effusion, left knee: Secondary | ICD-10-CM | POA: Diagnosis not present

## 2024-08-02 DIAGNOSIS — M79662 Pain in left lower leg: Secondary | ICD-10-CM | POA: Diagnosis not present

## 2024-08-02 DIAGNOSIS — M1712 Unilateral primary osteoarthritis, left knee: Secondary | ICD-10-CM | POA: Diagnosis not present

## 2024-08-02 DIAGNOSIS — M25561 Pain in right knee: Secondary | ICD-10-CM | POA: Diagnosis not present

## 2024-08-21 DIAGNOSIS — M5412 Radiculopathy, cervical region: Secondary | ICD-10-CM | POA: Diagnosis not present

## 2024-08-21 DIAGNOSIS — M25521 Pain in right elbow: Secondary | ICD-10-CM | POA: Diagnosis not present

## 2024-08-22 DIAGNOSIS — Z23 Encounter for immunization: Secondary | ICD-10-CM | POA: Diagnosis not present

## 2024-08-23 DIAGNOSIS — M25521 Pain in right elbow: Secondary | ICD-10-CM | POA: Diagnosis not present

## 2024-08-27 DIAGNOSIS — M542 Cervicalgia: Secondary | ICD-10-CM | POA: Diagnosis not present

## 2024-08-30 DIAGNOSIS — G959 Disease of spinal cord, unspecified: Secondary | ICD-10-CM | POA: Diagnosis not present

## 2024-08-30 DIAGNOSIS — G5621 Lesion of ulnar nerve, right upper limb: Secondary | ICD-10-CM | POA: Diagnosis not present

## 2024-09-13 DIAGNOSIS — M25562 Pain in left knee: Secondary | ICD-10-CM | POA: Diagnosis not present

## 2024-09-17 DIAGNOSIS — M25562 Pain in left knee: Secondary | ICD-10-CM | POA: Diagnosis not present

## 2024-09-19 DIAGNOSIS — M25562 Pain in left knee: Secondary | ICD-10-CM | POA: Diagnosis not present

## 2024-09-24 DIAGNOSIS — M25562 Pain in left knee: Secondary | ICD-10-CM | POA: Diagnosis not present

## 2024-09-26 DIAGNOSIS — M25562 Pain in left knee: Secondary | ICD-10-CM | POA: Diagnosis not present

## 2024-10-01 DIAGNOSIS — M25562 Pain in left knee: Secondary | ICD-10-CM | POA: Diagnosis not present

## 2024-10-02 DIAGNOSIS — G5621 Lesion of ulnar nerve, right upper limb: Secondary | ICD-10-CM | POA: Diagnosis not present

## 2024-10-03 DIAGNOSIS — M25562 Pain in left knee: Secondary | ICD-10-CM | POA: Diagnosis not present

## 2024-10-09 DIAGNOSIS — M25562 Pain in left knee: Secondary | ICD-10-CM | POA: Diagnosis not present

## 2024-10-11 DIAGNOSIS — M25562 Pain in left knee: Secondary | ICD-10-CM | POA: Diagnosis not present

## 2024-10-15 DIAGNOSIS — M25562 Pain in left knee: Secondary | ICD-10-CM | POA: Diagnosis not present

## 2024-10-17 DIAGNOSIS — M25562 Pain in left knee: Secondary | ICD-10-CM | POA: Diagnosis not present

## 2024-10-22 DIAGNOSIS — M25562 Pain in left knee: Secondary | ICD-10-CM | POA: Diagnosis not present

## 2024-10-24 DIAGNOSIS — M25562 Pain in left knee: Secondary | ICD-10-CM | POA: Diagnosis not present

## 2024-10-25 DIAGNOSIS — G5621 Lesion of ulnar nerve, right upper limb: Secondary | ICD-10-CM | POA: Diagnosis not present

## 2024-10-25 DIAGNOSIS — M25562 Pain in left knee: Secondary | ICD-10-CM | POA: Diagnosis not present

## 2024-10-25 DIAGNOSIS — M47812 Spondylosis without myelopathy or radiculopathy, cervical region: Secondary | ICD-10-CM | POA: Diagnosis not present

## 2024-10-25 DIAGNOSIS — M1712 Unilateral primary osteoarthritis, left knee: Secondary | ICD-10-CM | POA: Diagnosis not present

## 2024-11-02 DIAGNOSIS — M1712 Unilateral primary osteoarthritis, left knee: Secondary | ICD-10-CM | POA: Diagnosis not present

## 2024-11-02 DIAGNOSIS — G8918 Other acute postprocedural pain: Secondary | ICD-10-CM | POA: Diagnosis not present

## 2024-11-05 DIAGNOSIS — M25562 Pain in left knee: Secondary | ICD-10-CM | POA: Diagnosis not present
